# Patient Record
Sex: Male | Born: 1990
Health system: Southern US, Community
[De-identification: ages and names within clinical notes are randomized; demographics above are authoritative.]

## PROBLEM LIST (undated history)

## (undated) DIAGNOSIS — E119 Type 2 diabetes mellitus without complications: Secondary | ICD-10-CM

## (undated) DIAGNOSIS — F840 Autistic disorder: Secondary | ICD-10-CM

## (undated) HISTORY — DX: Autistic disorder: F84.0

## (undated) HISTORY — PX: WISDOM TOOTH EXTRACTION: SHX21

## (undated) HISTORY — DX: Type 2 diabetes mellitus without complications: E11.9

---
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
## 2008-01-08 ENCOUNTER — Ambulatory Visit (HOSPITAL_COMMUNITY): Payer: Self-pay | Admitting: Psychiatry

## 2008-04-01 ENCOUNTER — Ambulatory Visit (HOSPITAL_COMMUNITY): Payer: Self-pay | Admitting: Psychiatry

## 2008-06-26 ENCOUNTER — Ambulatory Visit (HOSPITAL_COMMUNITY): Payer: Self-pay | Admitting: Psychiatry

## 2008-09-27 ENCOUNTER — Ambulatory Visit (HOSPITAL_COMMUNITY): Payer: Self-pay | Admitting: Psychiatry

## 2008-12-27 ENCOUNTER — Ambulatory Visit (HOSPITAL_COMMUNITY): Payer: Self-pay | Admitting: Psychiatry

## 2009-04-25 ENCOUNTER — Ambulatory Visit (HOSPITAL_COMMUNITY): Payer: Self-pay | Admitting: Psychiatry

## 2009-09-19 ENCOUNTER — Ambulatory Visit (HOSPITAL_COMMUNITY): Payer: Self-pay | Admitting: Psychiatry

## 2010-01-09 ENCOUNTER — Ambulatory Visit (HOSPITAL_COMMUNITY): Payer: Self-pay | Admitting: Psychiatry

## 2010-04-14 ENCOUNTER — Ambulatory Visit (HOSPITAL_COMMUNITY): Payer: Self-pay | Admitting: Psychiatry

## 2010-07-01 ENCOUNTER — Ambulatory Visit (HOSPITAL_COMMUNITY): Payer: Self-pay | Admitting: Psychiatry

## 2010-10-30 ENCOUNTER — Ambulatory Visit (HOSPITAL_COMMUNITY): Payer: Self-pay | Admitting: Psychiatry

## 2011-01-05 ENCOUNTER — Encounter
Admission: RE | Admit: 2011-01-05 | Discharge: 2011-01-19 | Payer: Self-pay | Source: Home / Self Care | Attending: Emergency Medicine | Admitting: Emergency Medicine

## 2011-01-29 ENCOUNTER — Encounter (HOSPITAL_COMMUNITY): Payer: No Typology Code available for payment source | Admitting: Physician Assistant

## 2011-01-29 DIAGNOSIS — F411 Generalized anxiety disorder: Secondary | ICD-10-CM

## 2011-01-29 DIAGNOSIS — F988 Other specified behavioral and emotional disorders with onset usually occurring in childhood and adolescence: Secondary | ICD-10-CM

## 2011-04-30 ENCOUNTER — Encounter (HOSPITAL_COMMUNITY): Payer: No Typology Code available for payment source | Admitting: Physician Assistant

## 2011-04-30 DIAGNOSIS — F988 Other specified behavioral and emotional disorders with onset usually occurring in childhood and adolescence: Secondary | ICD-10-CM

## 2011-04-30 DIAGNOSIS — F411 Generalized anxiety disorder: Secondary | ICD-10-CM

## 2011-07-29 ENCOUNTER — Encounter (HOSPITAL_COMMUNITY): Payer: No Typology Code available for payment source | Admitting: Physician Assistant

## 2011-07-29 DIAGNOSIS — F988 Other specified behavioral and emotional disorders with onset usually occurring in childhood and adolescence: Secondary | ICD-10-CM

## 2011-07-29 DIAGNOSIS — F411 Generalized anxiety disorder: Secondary | ICD-10-CM

## 2011-07-30 ENCOUNTER — Encounter (HOSPITAL_COMMUNITY): Payer: No Typology Code available for payment source | Admitting: Physician Assistant

## 2011-11-01 ENCOUNTER — Other Ambulatory Visit (HOSPITAL_COMMUNITY): Payer: Self-pay | Admitting: *Deleted

## 2011-11-01 DIAGNOSIS — F411 Generalized anxiety disorder: Secondary | ICD-10-CM

## 2011-11-01 MED ORDER — METHYLPHENIDATE HCL ER (OSM) 36 MG PO TBCR: 36.0000 mg | EXTENDED_RELEASE_TABLET | ORAL | Status: DC

## 2011-11-02 ENCOUNTER — Encounter (HOSPITAL_COMMUNITY): Payer: No Typology Code available for payment source | Admitting: Physician Assistant

## 2011-11-08 ENCOUNTER — Other Ambulatory Visit (HOSPITAL_COMMUNITY): Payer: Self-pay

## 2011-11-09 ENCOUNTER — Ambulatory Visit (INDEPENDENT_AMBULATORY_CARE_PROVIDER_SITE_OTHER): Payer: No Typology Code available for payment source | Admitting: Physician Assistant

## 2011-11-09 DIAGNOSIS — F988 Other specified behavioral and emotional disorders with onset usually occurring in childhood and adolescence: Secondary | ICD-10-CM

## 2011-11-09 DIAGNOSIS — F411 Generalized anxiety disorder: Secondary | ICD-10-CM

## 2011-11-09 MED ORDER — METHYLPHENIDATE HCL ER (OSM) 36 MG PO TBCR: 36.0000 mg | EXTENDED_RELEASE_TABLET | ORAL | Status: DC

## 2011-11-09 MED ORDER — SERTRALINE HCL 50 MG PO TABS: 50.0000 mg | ORAL_TABLET | Freq: Every day | ORAL | Status: DC

## 2011-11-09 NOTE — Progress Notes (Signed)
   [facility] Follow-up Outpatient Visit  Alexander Wilkerson 1991-08-27  Date: 11/09/11   Subjective: Patient reports doing well.  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  Reports his mood is stable and anxiety is well managed.  Sleep and appetite are good.  Denies SI/HI or AVH.   There were no vitals filed for this visit.  Mental Status Examination  Appearance: Casual Alert: Yes Attention: good  Cooperative: Yes Eye Contact: Fair Speech: Clear and even Psychomotor Activity: Normal Memory/Concentration: WNL Oriented: person, place, time/date and situation Mood: Euthymic Affect: Congruent Thought Processes and Associations: Goal Directed Fund of Knowledge: Good Thought Content:  Insight: Good Judgement: Good  Diagnosis: GAD and ADHD  Treatment Plan: Continue current meds and Follow up in three months  Evadna Donaghy, PA

## 2012-02-09 ENCOUNTER — Ambulatory Visit (HOSPITAL_COMMUNITY): Payer: No Typology Code available for payment source | Admitting: Physician Assistant

## 2012-02-14 ENCOUNTER — Ambulatory Visit (INDEPENDENT_AMBULATORY_CARE_PROVIDER_SITE_OTHER): Payer: No Typology Code available for payment source | Admitting: Physician Assistant

## 2012-02-14 DIAGNOSIS — F988 Other specified behavioral and emotional disorders with onset usually occurring in childhood and adolescence: Secondary | ICD-10-CM

## 2012-02-14 DIAGNOSIS — F411 Generalized anxiety disorder: Secondary | ICD-10-CM

## 2012-02-14 MED ORDER — METHYLPHENIDATE HCL ER (OSM) 36 MG PO TBCR: 36.0000 mg | EXTENDED_RELEASE_TABLET | Freq: Every day | ORAL | Status: DC

## 2012-02-14 MED ORDER — METHYLPHENIDATE HCL ER (OSM) 36 MG PO TBCR: 36.0000 mg | EXTENDED_RELEASE_TABLET | ORAL | Status: DC

## 2012-02-14 NOTE — Progress Notes (Signed)
   [facility] Follow-up Outpatient Visit  DURAN OHERN [birth year]  Date: 02/14/12   Subjective: Leonce presents today to followup on his medications for ADHD and anxiety. 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 He feels that his medications are working well. He states that his anxiety is well managed. He feels that the Concerta is appropriate for his ADHD. He denies any SI/HI or AVH.  There were no vitals filed for this visit.  Mental Status Examination  Appearance: Well groomed and casually dressed Alert: Yes Attention: good  Cooperative: Yes Eye Contact: Good Speech: Clear and even Psychomotor Activity: Normal Memory/Concentration: Intact Oriented: person, place, time/date and situation Mood: Euthymic Affect: Constricted Thought Processes and Associations: Coherent and Logical Fund of Knowledge: Good Thought Content:  Insight: Good Judgement: Good  Diagnosis: ADHD, generalized anxiety disorder  Treatment Plan: Continue Concerta at 36 mg daily and Zoloft at 50 mg daily. Followup in 3 months.  Terance Pomplun, PA

## 2012-05-08 ENCOUNTER — Ambulatory Visit (INDEPENDENT_AMBULATORY_CARE_PROVIDER_SITE_OTHER): Payer: No Typology Code available for payment source | Admitting: Physician Assistant

## 2012-05-08 DIAGNOSIS — F988 Other specified behavioral and emotional disorders with onset usually occurring in childhood and adolescence: Secondary | ICD-10-CM | POA: Insufficient documentation

## 2012-05-08 DIAGNOSIS — F909 Attention-deficit hyperactivity disorder, unspecified type: Secondary | ICD-10-CM

## 2012-05-08 DIAGNOSIS — F411 Generalized anxiety disorder: Secondary | ICD-10-CM | POA: Insufficient documentation

## 2012-05-08 MED ORDER — METHYLPHENIDATE HCL ER (OSM) 36 MG PO TBCR: 36.0000 mg | EXTENDED_RELEASE_TABLET | Freq: Every day | ORAL | Status: DC

## 2012-05-08 MED ORDER — SERTRALINE HCL 50 MG PO TABS: 50.0000 mg | ORAL_TABLET | Freq: Every day | ORAL | Status: DC

## 2012-05-08 MED ORDER — METHYLPHENIDATE HCL ER (OSM) 36 MG PO TBCR: 36.0000 mg | EXTENDED_RELEASE_TABLET | ORAL | Status: DC

## 2012-05-08 NOTE — Progress Notes (Signed)
   [facility] Behavioral Health Follow-up Outpatient Visit  Alexander Wilkerson 02/02/1991  Date: 05/08/2012   Subjective: Alexander Wilkerson presents today to followup on medications prescribed for depression and ADHD. 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 He reports that his mood has been stable his anxiety is well managed. He denies any problems with the medications. He is sleeping and eating well.  There were no vitals filed for this visit.  Mental Status Examination  Appearance: Well groomed and casually dressed Alert: Yes Attention: good  Cooperative: Yes Eye Contact: Fair Speech: Clear and coherent Psychomotor Activity: Normal Memory/Concentration: Intact Oriented: person, place, time/date and situation Mood: Euthymic Affect: Appropriate Thought Processes and Associations: Linear Fund of Knowledge: Good Thought Content: Normal Insight: Good Judgement: Good  Diagnosis: Generalized anxiety disorder, ADHD  Treatment Plan: We'll continue his Zoloft 50 mg daily, and Concerta 36 mg daily. He will followup in 3 months.  Katrese Shell, PA-C

## 2012-09-05 ENCOUNTER — Other Ambulatory Visit (HOSPITAL_COMMUNITY): Payer: Self-pay | Admitting: *Deleted

## 2012-09-05 DIAGNOSIS — F988 Other specified behavioral and emotional disorders with onset usually occurring in childhood and adolescence: Secondary | ICD-10-CM

## 2012-09-05 MED ORDER — METHYLPHENIDATE HCL ER (OSM) 36 MG PO TBCR: 36.0000 mg | EXTENDED_RELEASE_TABLET | Freq: Every day | ORAL | Status: DC

## 2012-09-05 NOTE — Telephone Encounter (Signed)
Jorje Guild, PA not available to sign RX printed earlier. Reprinted for Dr.Kumar's signature

## 2012-09-11 ENCOUNTER — Ambulatory Visit (INDEPENDENT_AMBULATORY_CARE_PROVIDER_SITE_OTHER): Payer: No Typology Code available for payment source | Admitting: Physician Assistant

## 2012-09-11 DIAGNOSIS — F988 Other specified behavioral and emotional disorders with onset usually occurring in childhood and adolescence: Secondary | ICD-10-CM

## 2012-09-11 DIAGNOSIS — F411 Generalized anxiety disorder: Secondary | ICD-10-CM

## 2012-09-11 MED ORDER — METHYLPHENIDATE HCL ER (OSM) 36 MG PO TBCR: 36.0000 mg | EXTENDED_RELEASE_TABLET | ORAL | Status: DC

## 2012-09-11 MED ORDER — METHYLPHENIDATE HCL ER (OSM) 36 MG PO TBCR: 36.0000 mg | EXTENDED_RELEASE_TABLET | Freq: Every day | ORAL | Status: DC

## 2012-09-11 NOTE — Progress Notes (Signed)
   [facility] Follow-up Outpatient Visit  Alexander Wilkerson 09-02-1991  Date: 09/11/2012   Subjective: Alexander Wilkerson presents today to followup on his treatment for ADHD and anxiety. He has started a new job in June working at the [employer] on [employer], [employer]. He reports that his anxiety is well managed, and feels that the Concerta is working well for his inattentiveness. He denies any suicidal or homicidal ideation. He denies any auditory or visual hallucinations. His sleep and appetite are good.  There were no vitals filed for this visit.  Mental Status Examination  Appearance: Casual Alert: Yes Attention: good  Cooperative: Yes Eye Contact: Good Speech: Clear and coherent Psychomotor Activity: Normal Memory/Concentration: Intact Oriented: person, place, time/date and situation Mood: Euthymic Affect: Appropriate Thought Processes and Associations: Linear Fund of Knowledge: Good Thought Content: Normal Insight: Good Judgement: Good  Diagnosis: ADHD, inattentive type; generalized anxiety disorder  Treatment Plan: We'll continue his Concerta 36 mg daily, and Zoloft 50 mg daily. He will return for followup in 3 months.  Alba Perillo, PA-C

## 2012-11-07 ENCOUNTER — Other Ambulatory Visit (HOSPITAL_COMMUNITY): Payer: Self-pay | Admitting: *Deleted

## 2012-11-07 DIAGNOSIS — F411 Generalized anxiety disorder: Secondary | ICD-10-CM

## 2012-11-07 MED ORDER — SERTRALINE HCL 50 MG PO TABS: 50.0000 mg | ORAL_TABLET | Freq: Every day | ORAL | Status: DC

## 2012-11-14 ENCOUNTER — Other Ambulatory Visit (HOSPITAL_COMMUNITY): Payer: Self-pay | Admitting: Physician Assistant

## 2012-11-30 ENCOUNTER — Ambulatory Visit (INDEPENDENT_AMBULATORY_CARE_PROVIDER_SITE_OTHER): Payer: No Typology Code available for payment source | Admitting: Physician Assistant

## 2012-11-30 DIAGNOSIS — F988 Other specified behavioral and emotional disorders with onset usually occurring in childhood and adolescence: Secondary | ICD-10-CM

## 2012-11-30 DIAGNOSIS — F411 Generalized anxiety disorder: Secondary | ICD-10-CM

## 2012-11-30 MED ORDER — METHYLPHENIDATE HCL ER (OSM) 36 MG PO TBCR: 36.0000 mg | EXTENDED_RELEASE_TABLET | ORAL | Status: DC

## 2012-11-30 MED ORDER — METHYLPHENIDATE HCL ER (OSM) 36 MG PO TBCR: 36.0000 mg | EXTENDED_RELEASE_TABLET | Freq: Every day | ORAL | Status: DC

## 2012-11-30 MED ORDER — SERTRALINE HCL 50 MG PO TABS: 50.0000 mg | ORAL_TABLET | Freq: Every day | ORAL | Status: DC

## 2012-11-30 NOTE — Progress Notes (Signed)
   [facility] Behavioral Health Follow-up Outpatient Visit  Alexander Wilkerson Aug 13, 1991  Date: 11/30/2012   Subjective: Alexander Wilkerson presents today to followup on his treatment for anxiety and depression. He reports he has been doing well. 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 He feels his anxiety is well managed. He feels the Concerta is still helpful, and denies any bothersome side effects. He is sleeping well and his appetite is good. He denies any suicidal or homicidal ideation. He denies any auditory or visual hallucinations.  There were no vitals filed for this visit.  Mental Status Examination  Appearance: Casual Alert: Yes Attention: good  Cooperative: Yes Eye Contact: Good Speech: Clear and coherent Psychomotor Activity: Normal Memory/Concentration: Intact Oriented: person, place, time/date and situation Mood: Euthymic Affect: Appropriate Thought Processes and Associations: Linear Fund of Knowledge: Good Thought Content: Normal Insight: Good Judgement: Good  Diagnosis: ADHD, inattentive type; generalized anxiety disorder  Treatment Plan: We will continue his Concerta 36 mg daily, and Zoloft 50 mg daily. He will return for followup in 3 months.  Ishaan Villamar, PA-C

## 2013-03-01 ENCOUNTER — Ambulatory Visit (INDEPENDENT_AMBULATORY_CARE_PROVIDER_SITE_OTHER): Payer: No Typology Code available for payment source | Admitting: Physician Assistant

## 2013-03-01 DIAGNOSIS — F988 Other specified behavioral and emotional disorders with onset usually occurring in childhood and adolescence: Secondary | ICD-10-CM

## 2013-03-01 DIAGNOSIS — F411 Generalized anxiety disorder: Secondary | ICD-10-CM

## 2013-03-01 MED ORDER — METHYLPHENIDATE HCL ER (OSM) 36 MG PO TBCR: 36.0000 mg | EXTENDED_RELEASE_TABLET | Freq: Every day | ORAL | Status: DC

## 2013-03-01 MED ORDER — METHYLPHENIDATE HCL ER (OSM) 36 MG PO TBCR: 36.0000 mg | EXTENDED_RELEASE_TABLET | ORAL | Status: DC
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
## 2013-06-04 ENCOUNTER — Ambulatory Visit (HOSPITAL_COMMUNITY): Payer: Self-pay | Admitting: Physician Assistant

## 2013-06-05 ENCOUNTER — Other Ambulatory Visit (HOSPITAL_COMMUNITY): Payer: Self-pay | Admitting: Physician Assistant

## 2013-06-19 ENCOUNTER — Encounter (HOSPITAL_COMMUNITY): Payer: Self-pay | Admitting: Physician Assistant

## 2013-06-19 ENCOUNTER — Ambulatory Visit (INDEPENDENT_AMBULATORY_CARE_PROVIDER_SITE_OTHER): Payer: No Typology Code available for payment source | Admitting: Physician Assistant

## 2013-06-19 VITALS — BP 120/74 | HR 88 | Ht 64.0 in | Wt 152.2 lb

## 2013-06-19 DIAGNOSIS — F988 Other specified behavioral and emotional disorders with onset usually occurring in childhood and adolescence: Secondary | ICD-10-CM

## 2013-06-19 DIAGNOSIS — F411 Generalized anxiety disorder: Secondary | ICD-10-CM

## 2013-06-19 MED ORDER — METHYLPHENIDATE HCL ER (OSM) 36 MG PO TBCR: 36.0000 mg | EXTENDED_RELEASE_TABLET | ORAL | Status: DC

## 2013-06-19 MED ORDER — METHYLPHENIDATE HCL ER (OSM) 36 MG PO TBCR: 36.0000 mg | EXTENDED_RELEASE_TABLET | Freq: Every day | ORAL | Status: DC

## 2013-06-19 NOTE — Progress Notes (Signed)
   [facility] Behavioral Health Follow-up Outpatient Visit  Alexander Wilkerson [birth year]  Date: 06/19/2013   Subjective: [name] presents today to followup on his treatment for anxiety and ADHD. He reports that he is doing very well. He feels his anxiety is well controlled. Feels the Concerta is working well for him. He denies any side effects. He states that his appetite is good, and he is sleeping well. He denies any suicidal or homicidal ideation. He denies any auditory or visual hallucinations.  Filed Vitals:   06/19/13 1541  BP: 120/74  Pulse: 88    Mental Status Examination  Appearance: Casual Alert: Yes Attention: good  Cooperative: Yes Eye Contact: Good Speech: Clear and coherent Psychomotor Activity: Normal Memory/Concentration: Intact Oriented: person, place, time/date and situation Mood: Anxious Affect: Congruent Thought Processes and Associations: Logical Fund of Knowledge: Good Thought Content: Normal Insight: Good Judgement: Good  Diagnosis: Generalized anxiety disorder, ADHD inattentive type  Treatment Plan: We will continue his Concerta 36 mg daily, and his Zoloft 50 mg daily. He will return for followup in 3 months.  Editha Bridgeforth, PA-C

## 2013-09-20 ENCOUNTER — Encounter (HOSPITAL_COMMUNITY): Payer: Self-pay | Admitting: Physician Assistant

## 2013-09-20 ENCOUNTER — Ambulatory Visit (INDEPENDENT_AMBULATORY_CARE_PROVIDER_SITE_OTHER): Payer: No Typology Code available for payment source | Admitting: Physician Assistant

## 2013-09-20 VITALS — BP 139/74 | HR 111 | Ht 64.0 in | Wt 150.0 lb

## 2013-09-20 DIAGNOSIS — F411 Generalized anxiety disorder: Secondary | ICD-10-CM

## 2013-09-20 DIAGNOSIS — F988 Other specified behavioral and emotional disorders with onset usually occurring in childhood and adolescence: Secondary | ICD-10-CM

## 2013-09-20 MED ORDER — METHYLPHENIDATE HCL ER (OSM) 36 MG PO TBCR: 36.0000 mg | EXTENDED_RELEASE_TABLET | Freq: Every day | ORAL | Status: DC

## 2013-09-20 MED ORDER — METHYLPHENIDATE HCL ER (OSM) 36 MG PO TBCR: 36.0000 mg | EXTENDED_RELEASE_TABLET | ORAL | Status: DC

## 2013-09-20 NOTE — Progress Notes (Signed)
   [facility] Behavioral Health Follow-up Outpatient Visit  Alexander Wilkerson 1991-05-20  Date: 09/20/2013  Subjective: [name] presents today to followup on his treatment for ADHD and anxiety. He reports everything is going well. 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 He reports that his anxiety is well managed. He also feels that the Concerta is doing a good job. He denies any side effects. He reports that he is eating and sleeping well.  Filed Vitals:   09/20/13 1504  BP: 139/74  Pulse: 111    Mental Status Examination  Appearance: Casual Alert: Yes Attention: good  Cooperative: Yes Eye Contact: Minimal Speech: Clear and coherent Psychomotor Activity: Normal Memory/Concentration: Intact Oriented: person, place, time/date and situation Mood: Anxious Affect: Congruent Thought Processes and Associations: Linear Fund of Knowledge: Good Thought Content: Normal Insight: Good Judgement: Good  Diagnosis: Generalized anxiety disorder, ADHD inattentive type  Treatment Plan: We will continue his Concerta 36 mg daily, and Zoloft 50 mg daily. He will return for followup in 3 months.  Mauriah Mcmillen, PA-C

## 2013-12-04 ENCOUNTER — Other Ambulatory Visit (HOSPITAL_COMMUNITY): Payer: Self-pay | Admitting: *Deleted

## 2013-12-04 DIAGNOSIS — F411 Generalized anxiety disorder: Secondary | ICD-10-CM

## 2013-12-04 MED ORDER — SERTRALINE HCL 50 MG PO TABS: ORAL_TABLET | ORAL | Status: DC

## 2013-12-04 NOTE — Telephone Encounter (Signed)
Chart reviewed Refill appropriate Appt with Dr. Arfeen 12/25/13 

## 2013-12-25 ENCOUNTER — Ambulatory Visit (HOSPITAL_COMMUNITY): Payer: Self-pay | Admitting: Physician Assistant

## 2013-12-25 ENCOUNTER — Encounter (HOSPITAL_COMMUNITY): Payer: Self-pay | Admitting: Psychiatry

## 2013-12-25 ENCOUNTER — Ambulatory Visit (INDEPENDENT_AMBULATORY_CARE_PROVIDER_SITE_OTHER): Payer: PRIVATE HEALTH INSURANCE | Admitting: Psychiatry

## 2013-12-25 VITALS — BP 124/71 | HR 82 | Ht 64.0 in | Wt 154.0 lb

## 2013-12-25 DIAGNOSIS — F988 Other specified behavioral and emotional disorders with onset usually occurring in childhood and adolescence: Secondary | ICD-10-CM

## 2013-12-25 DIAGNOSIS — Z79899 Other long term (current) drug therapy: Secondary | ICD-10-CM

## 2013-12-25 DIAGNOSIS — F411 Generalized anxiety disorder: Secondary | ICD-10-CM

## 2013-12-25 MED ORDER — METHYLPHENIDATE HCL ER (OSM) 36 MG PO TBCR: 36.0000 mg | EXTENDED_RELEASE_TABLET | ORAL | Status: DC

## 2013-12-25 MED ORDER — SERTRALINE HCL 50 MG PO TABS: ORAL_TABLET | ORAL | Status: DC

## 2013-12-25 NOTE — Progress Notes (Addendum)
[facility] [id number] Progress Note  Arvilla MarketCarter M Percle [id number] 22 y.o.  12/25/2013 3:52 PM  Chief Complaint:  I need my medication.  History of Present Illness: Patient is 23 year old Caucasian, single employed man who came for his appointment.  His been seen in this office since October 2006.  He is a diagnoses of generalized anxiety disorder and ADHD.  He is seeing physician Asst. Rae HalstedAllen Watt left the practice.  Patient is taking Concerta and Zoloft.  He is feeling good with his medication.  He denies any insomnia, irritability, anger or any mood swing.  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  Patient is not drinking or using any illegal substances.  He denied any side effects of medication.  He denies any recent panic attack or any nervousness.  He is able to do multitasking.  His appetite, sleep and vitals are stable and unchanged from the past.  He had a good Christmas.  He visited his adopted parents in [place] on Christmas.  He lives by himself.  He has no children.  He has one adopted younger sister who is studying in [place].  Suicidal Ideation: No Plan Formed: No Patient has means to carry out plan: No  Homicidal Ideation: No Plan Formed: No Patient has means to carry out plan: No  Review of Systems: Psychiatric: Agitation: No Hallucination: No Depressed Mood: No Insomnia: No Hypersomnia: No Altered Concentration: No Feels Worthless: No Grandiose Ideas: No Belief In Special Powers: No New/Increased Substance Abuse: No Compulsions: No  Neurologic: Headache: No Seizure: No Paresthesias: No  Past Medical Family, Social History:  Patient has no active medical problems.  He was diagnosed with diabetes mellitus and used to take medication however he is on diet control for past 2 years .  He has no blood work in past 2 years and he has no primary care physician.  He denies any history of traumatic brain injury.  Patient was adopted at a young  age.  Patient has no knowledge about his biological parents.  He lived by himself.  He has no children.  He has high school education.  Outpatient Encounter Prescriptions as of 12/25/2013  Medication Sig  . methylphenidate (CONCERTA) 36 MG CR tablet Take 1 tablet (36 mg total) by mouth every morning.  . sertraline (ZOLOFT) 50 MG tablet TAKE ONE TABLET BY MOUTH ONCE DAILY  . [DISCONTINUED] methylphenidate (CONCERTA) 36 MG CR tablet Take 1 tablet (36 mg total) by mouth daily.  . [DISCONTINUED] methylphenidate (CONCERTA) 36 MG CR tablet Take 1 tablet (36 mg total) by mouth every morning.  . [DISCONTINUED] methylphenidate (CONCERTA) 36 MG CR tablet Take 1 tablet (36 mg total) by mouth every morning.  . [DISCONTINUED] methylphenidate (CONCERTA) 36 MG CR tablet Take 1 tablet (36 mg total) by mouth every morning.  . [DISCONTINUED] sertraline (ZOLOFT) 50 MG tablet TAKE ONE TABLET BY MOUTH ONCE DAILY    Past Psychiatric History/Hospitalization(s): Patient has ADHD.  In the past he had tried Adderall.  He is taking Concerta and Zoloft since 2006 Anxiety: Yes Bipolar Disorder: No Depression: No Mania: No Psychosis: No Schizophrenia: No Personality Disorder: No Hospitalization for psychiatric illness: No History of Electroconvulsive Shock Therapy: No Prior Suicide Attempts: No  Physical Exam: Constitutional:  BP 124/71  Pulse 82  Ht 5\' 4"  (1.626 m)  Wt 154 lb (69.854 kg)  BMI 26.42 kg/m2  General Appearance: alert, oriented, no acute distress and well nourished  Musculoskeletal: Strength & Muscle Tone: within  normal limits Gait & Station: normal Patient leans: N/A  Psychiatric: Speech (describe rate, volume, coherence, spontaneity, and abnormalities if any): Slow but clear and coherent.  Thought Process (describe rate, content, abstract reasoning, and computation): Logical and goal directed.  Associations: Irrelevant and Intact  Thoughts: normal  Mental Status: Orientation:  oriented to person, place, time/date and situation Mood & Affect: anxiety Attention Span & Concentration: Fair  Medical Decision Making (Choose Three): Established Problem, Stable/Improving (1), Review of Psycho-Social Stressors (1), Review or order clinical lab tests (1), Decision to obtain old records (1), Review and summation of old records (2), Review of Last Therapy Session (1) and Review of Medication Regimen & Side Effects (2)  Assessment: Axis I: ADD, generalized anxiety disorder  Axis II: Deferred  Axis III: History of diabetes  Axis IV: Mild  Axis V: 65-70   Plan: I reviewed his symptoms, collateral information, psychosocial history and his current medication.  Patient is taking Concerta 36 mg in the morning and Zoloft 50 mg at bedtime.  Patient does not have any side effects.  He has no blood work and past few years.  I will order CBC, CMP, hemoglobin A1c and lipid panel.  Recommend to call us back if he has any question or any concern.  Followup in 2 months.Time spent 25 minutes.  More than 50% of the time spent in psychoeducation, counseling and coordination of care.  Discuss safety plan that anytime having active suicidal thoughts or homicidal thoughts then patient need to call 911 or go to the local emergency room.    Melisha Eggleton T., MD 12/25/2013

## 2014-02-23 LAB — COMPREHENSIVE METABOLIC PANEL
ALT: 26 U/L (ref 0–53)
AST: 18 U/L (ref 0–37)
Albumin: 4.9 g/dL (ref 3.5–5.2)
Alkaline Phosphatase: 108 U/L (ref 39–117)
BILIRUBIN TOTAL: 0.6 mg/dL (ref 0.2–1.2)
BUN: 12 mg/dL (ref 6–23)
CALCIUM: 9.2 mg/dL (ref 8.4–10.5)
CO2: 26 meq/L (ref 19–32)
Chloride: 104 mEq/L (ref 96–112)
Creat: 1 mg/dL (ref 0.50–1.35)
Glucose, Bld: 81 mg/dL (ref 70–99)
POTASSIUM: 4.4 meq/L (ref 3.5–5.3)
SODIUM: 141 meq/L (ref 135–145)
Total Protein: 7 g/dL (ref 6.0–8.3)

## 2014-02-23 LAB — CBC WITH DIFFERENTIAL/PLATELET
BASOS ABS: 0 10*3/uL (ref 0.0–0.1)
Basophils Relative: 0 % (ref 0–1)
Eosinophils Absolute: 0.1 10*3/uL (ref 0.0–0.7)
Eosinophils Relative: 2 % (ref 0–5)
HEMATOCRIT: 50 % (ref 39.0–52.0)
HEMOGLOBIN: 17.7 g/dL — AB (ref 13.0–17.0)
Lymphocytes Relative: 38 % (ref 12–46)
Lymphs Abs: 2 10*3/uL (ref 0.7–4.0)
MCH: 30.7 pg (ref 26.0–34.0)
MCHC: 35.4 g/dL (ref 30.0–36.0)
MCV: 86.7 fL (ref 78.0–100.0)
MONO ABS: 0.4 10*3/uL (ref 0.1–1.0)
Monocytes Relative: 7 % (ref 3–12)
NEUTROS ABS: 2.8 10*3/uL (ref 1.7–7.7)
NEUTROS PCT: 53 % (ref 43–77)
PLATELETS: 265 10*3/uL (ref 150–400)
RBC: 5.77 MIL/uL (ref 4.22–5.81)
RDW: 13.3 % (ref 11.5–15.5)
WBC: 5.3 10*3/uL (ref 4.0–10.5)

## 2014-02-23 LAB — LIPID PANEL
CHOL/HDL RATIO: 3.7 ratio
CHOLESTEROL: 138 mg/dL (ref 0–200)
HDL: 37 mg/dL — AB (ref >39)
LDL CALC: 90 mg/dL (ref 0–99)
TRIGLYCERIDES: 56 mg/dL (ref <150)
VLDL: 11 mg/dL (ref 0–40)

## 2014-02-23 LAB — HEMOGLOBIN A1C
HEMOGLOBIN A1C: 5.3 % (ref <5.7)
Mean Plasma Glucose: 105 mg/dL (ref <117)

## 2014-02-23 LAB — TSH: TSH: 2.641 u[IU]/mL (ref 0.350–4.500)

## 2014-02-25 ENCOUNTER — Ambulatory Visit (INDEPENDENT_AMBULATORY_CARE_PROVIDER_SITE_OTHER): Payer: PRIVATE HEALTH INSURANCE | Admitting: Psychiatry

## 2014-02-25 ENCOUNTER — Encounter (HOSPITAL_COMMUNITY): Payer: Self-pay | Admitting: Psychiatry

## 2014-02-25 VITALS — BP 122/78 | HR 76 | Ht 64.0 in | Wt 147.2 lb

## 2014-02-25 DIAGNOSIS — F411 Generalized anxiety disorder: Secondary | ICD-10-CM

## 2014-02-25 DIAGNOSIS — F988 Other specified behavioral and emotional disorders with onset usually occurring in childhood and adolescence: Secondary | ICD-10-CM

## 2014-02-25 MED ORDER — SERTRALINE HCL 50 MG PO TABS: ORAL_TABLET | ORAL | Status: DC

## 2014-02-25 MED ORDER — METHYLPHENIDATE HCL ER (OSM) 36 MG PO TBCR: 36.0000 mg | EXTENDED_RELEASE_TABLET | ORAL | Status: DC

## 2014-02-25 NOTE — Progress Notes (Signed)
Alexander Wilkerson [id number] Progress Note  Alexander Wilkerson [id number] 23 y.o.  02/25/2014 3:57 PM  Chief Complaint:  Medication management of followup.  History of Present Illness: Guarded aim for his followup appointment.  His compliance with his psychotropic medication.  He denies any recent anxiety or panic attack.  He is working as a Development worker, international aid .  He likes his job.  He denies any irritability, anger or any mood swing.  He is able to do multitasking.  His attention and focus is good.  He has no tremors or shakes.  His appetite is good.  He has blood work which is normal.  His hemoglobins he is 5.3.  CBC is normal.  His basic chemistries also normal.  He is not drinking or using any illegal substances.  Suicidal Ideation: No Plan Formed: No Patient has means to carry out plan: No  Homicidal Ideation: No Plan Formed: No Patient has means to carry out plan: No  Review of Systems: Psychiatric: Agitation: No Hallucination: No Depressed Mood: No Insomnia: No Hypersomnia: No Altered Concentration: No Feels Worthless: No Grandiose Ideas: No Belief In Special Powers: No New/Increased Substance Abuse: No Compulsions: No  Neurologic: Headache: No Seizure: No Paresthesias: No  Past Medical Family, Social History:  Patient has no active medical problems.  He was diagnosed with diabetes mellitus and used to take medication however he is on diet control for past 2 years .  He has no blood work in past 2 years and he has no primary care physician.  He denies any history of traumatic brain injury.  Patient was adopted at a young age.  Patient has no knowledge about his biological parents.  He lived by himself.  He has no children.  He has high school education.  Outpatient Encounter Prescriptions as of 02/25/2014  Medication Sig  . methylphenidate (CONCERTA) 36 MG CR tablet Take 1 tablet (36 mg total) by mouth every morning.  . sertraline (ZOLOFT) 50 MG tablet TAKE ONE TABLET BY MOUTH  ONCE DAILY  . [DISCONTINUED] methylphenidate (CONCERTA) 36 MG CR tablet Take 1 tablet (36 mg total) by mouth every morning.  . [DISCONTINUED] methylphenidate (CONCERTA) 36 MG CR tablet Take 1 tablet (36 mg total) by mouth every morning.  . [DISCONTINUED] methylphenidate (CONCERTA) 36 MG CR tablet Take 1 tablet (36 mg total) by mouth every morning.  . [DISCONTINUED] sertraline (ZOLOFT) 50 MG tablet TAKE ONE TABLET BY MOUTH ONCE DAILY    Past Psychiatric History/Hospitalization(s): Patient has ADHD.  In the past he had tried Adderall.  He is taking Concerta and Zoloft since 2006 Anxiety: Yes Bipolar Disorder: No Depression: No Mania: No Psychosis: No Schizophrenia: No Personality Disorder: No Hospitalization for psychiatric illness: No History of Electroconvulsive Shock Therapy: No Prior Suicide Attempts: No  Recent Results (from the past 2160 hour(s))  HEMOGLOBIN A1C     Status: None   Collection Time    02/23/14  8:16 AM      Result Value Ref Range   Hemoglobin A1C 5.3  <5.7 %   Comment:                                                                            According  to the ADA Clinical Practice Recommendations for 2011, when     HbA1c is used as a screening test:             >=6.5%   Diagnostic of Diabetes Mellitus                (if abnormal result is confirmed)           5.7-6.4%   Increased risk of developing Diabetes Mellitus           References:Diagnosis and Classification of Diabetes Mellitus,Diabetes     TWSF,6812,75(TZGYF 1):S62-S69 and Standards of Medical Care in             Diabetes - 2011,Diabetes VCBS,4967,59 (Suppl 1):S11-S61.         Mean Plasma Glucose 105  <117 mg/dL  CBC WITH DIFFERENTIAL     Status: Abnormal   Collection Time    02/23/14  8:16 AM      Result Value Ref Range   WBC 5.3  4.0 - 10.5 K/uL   RBC 5.77  4.22 - 5.81 MIL/uL   Hemoglobin 17.7 (*) 13.0 - 17.0 g/dL   HCT 50.0  39.0 - 52.0 %   MCV 86.7  78.0 - 100.0 fL   MCH 30.7  26.0 -  34.0 pg   MCHC 35.4  30.0 - 36.0 g/dL   RDW 13.3  11.5 - 15.5 %   Platelets 265  150 - 400 K/uL   Neutrophils Relative % 53  43 - 77 %   Neutro Abs 2.8  1.7 - 7.7 K/uL   Lymphocytes Relative 38  12 - 46 %   Lymphs Abs 2.0  0.7 - 4.0 K/uL   Monocytes Relative 7  3 - 12 %   Monocytes Absolute 0.4  0.1 - 1.0 K/uL   Eosinophils Relative 2  0 - 5 %   Eosinophils Absolute 0.1  0.0 - 0.7 K/uL   Basophils Relative 0  0 - 1 %   Basophils Absolute 0.0  0.0 - 0.1 K/uL   Smear Review Criteria for review not met    COMPREHENSIVE METABOLIC PANEL     Status: None   Collection Time    02/23/14  8:16 AM      Result Value Ref Range   Sodium 141  135 - 145 mEq/L   Potassium 4.4  3.5 - 5.3 mEq/L   Chloride 104  96 - 112 mEq/L   CO2 26  19 - 32 mEq/L   Glucose, Bld 81  70 - 99 mg/dL   BUN 12  6 - 23 mg/dL   Creat 1.00  0.50 - 1.35 mg/dL   Total Bilirubin 0.6  0.2 - 1.2 mg/dL   Alkaline Phosphatase 108  39 - 117 U/L   AST 18  0 - 37 U/L   ALT 26  0 - 53 U/L   Total Protein 7.0  6.0 - 8.3 g/dL   Albumin 4.9  3.5 - 5.2 g/dL   Calcium 9.2  8.4 - 10.5 mg/dL  LIPID PANEL     Status: Abnormal   Collection Time    02/23/14  8:16 AM      Result Value Ref Range   Cholesterol 138  0 - 200 mg/dL   Comment: ATP III Classification:           < 200        mg/dL        Desirable  200 - 239     mg/dL        Borderline High          >= 240        mg/dL        High         Triglycerides 56  <150 mg/dL   HDL 37 (*) >39 mg/dL   Total CHOL/HDL Ratio 3.7     VLDL 11  0 - 40 mg/dL   LDL Cholesterol 90  0 - 99 mg/dL   Comment:       Total Cholesterol/HDL Ratio:CHD Risk                            Coronary Heart Disease Risk Table                                            Men       Women              1/2 Average Risk              3.4        3.3                  Average Risk              5.0        4.4               2X Average Risk              9.6        7.1               3X Average Risk              23.4       11.0     Use the calculated Patient Ratio above and the CHD Risk table      to determine the patient's CHD Risk.     ATP III Classification (LDL):           < 100        mg/dL         Optimal          100 - 129     mg/dL         Near or Above Optimal          130 - 159     mg/dL         Borderline High          160 - 189     mg/dL         High           > 190        mg/dL         Very High        TSH     Status: None   Collection Time    02/23/14  8:16 AM      Result Value Ref Range   TSH 2.641  0.350 - 4.500 uIU/mL    Physical Exam: Constitutional:  BP 122/78  Pulse 76  Ht $R'5\' 4"'Mu$  (1.626 m)  Wt 147 lb 3.2 oz (66.769 kg)  BMI 25.25 kg/m2  General Appearance: alert, oriented, no acute distress and  well nourished  Musculoskeletal: Strength & Muscle Tone: within normal limits Gait & Station: normal Patient leans: N/A  Psychiatric: Speech (describe rate, volume, coherence, spontaneity, and abnormalities if any): Slow but clear and coherent.  Thought Process (describe rate, content, abstract reasoning, and computation): Logical and goal directed.  Associations: Irrelevant and Intact  Thoughts: normal  Mental Status: Orientation: oriented to person, place, time/date and situation Mood & Affect: anxiety Attention Span & Concentration: Fair  Established Problem, Stable/Improving (1), Review or order clinical lab tests (1), Review of Last Therapy Session (1) and Review of Medication Regimen & Side Effects (2)  Assessment: Axis I: ADD, generalized anxiety disorder  Axis II: Deferred  Axis III: History of diabetes  Axis IV: Mild  Axis V: 65-70   Plan:  I reviewed his blood work and his current medication.  Patient is doing better on his medication.  A result discussed with him.  I will continue Concerta 36 mg daily and Zoloft 50 mg daily.  Followup in 3 months.  I recommended to call us back if he has any question or any concern.    Machi Whittaker T.,  MD 02/25/2014

## 2014-05-28 ENCOUNTER — Ambulatory Visit (INDEPENDENT_AMBULATORY_CARE_PROVIDER_SITE_OTHER): Payer: PRIVATE HEALTH INSURANCE | Admitting: Psychiatry

## 2014-05-28 ENCOUNTER — Encounter (HOSPITAL_COMMUNITY): Payer: Self-pay | Admitting: Psychiatry

## 2014-05-28 VITALS — BP 107/62 | HR 76 | Ht 64.0 in | Wt 148.0 lb

## 2014-05-28 DIAGNOSIS — F411 Generalized anxiety disorder: Secondary | ICD-10-CM

## 2014-05-28 DIAGNOSIS — F988 Other specified behavioral and emotional disorders with onset usually occurring in childhood and adolescence: Secondary | ICD-10-CM

## 2014-05-28 MED ORDER — SERTRALINE HCL 50 MG PO TABS: ORAL_TABLET | ORAL | Status: DC

## 2014-05-28 MED ORDER — METHYLPHENIDATE HCL ER (OSM) 36 MG PO TBCR: 36.0000 mg | EXTENDED_RELEASE_TABLET | ORAL | Status: DC

## 2014-05-28 NOTE — Progress Notes (Signed)
[facility] [id number] Progress Note  Alexander Wilkerson [id number] 23 y.o.  05/28/2014 4:10 PM  Chief Complaint:  Medication management of followup.  History of Present Illness: Alexander Wilkerson came for his followup appointment.  He is taking his medication as prescribed.  His attention and focus is good.  He is able to do multitasking.  He denies any irritability, anger, mood swing.  He denies any crying spells.  His sleep is good.  His appetite is good.  His vitals are stable.  He denies any panic attack .  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  Patient has no tremors or shakes.  He was to continue his medication.  He denies any chest pain, insomnia, irritability, anger or any hallucination.  He is not drinking or using any illegal substances.  He has blood work on 02/23/2014 which shows hemoglobin A1c is 5.3, his CBC is normal and his comprehensive metabolic panel was also normal.  Suicidal Ideation: No Plan Formed: No Patient has means to carry out plan: No  Homicidal Ideation: No Plan Formed: No Patient has means to carry out plan: No  Review of Systems: Psychiatric: Agitation: No Hallucination: No Depressed Mood: No Insomnia: No Hypersomnia: No Altered Concentration: No Feels Worthless: No Grandiose Ideas: No Belief In Special Powers: No New/Increased Substance Abuse: No Compulsions: No  Neurologic: Headache: No Seizure: No Paresthesias: No  Past Medical Family, Social History:  Patient has no active medical problems.  He was diagnosed with diabetes mellitus and used to take medication however he is on diet control for past 2 years .  He goes to Dr. Talmage Nap for his medical checkup.  Patient was adopted at a young age.  Patient has no knowledge about his biological parents.  He lived by himself.  He has no children.  He has high school education.  Outpatient Encounter Prescriptions as of 05/28/2014  Medication Sig  .  methylphenidate 36 MG PO CR tablet Take 1 tablet (36 mg total) by mouth every morning.  . sertraline (ZOLOFT) 50 MG tablet TAKE ONE TABLET BY MOUTH ONCE DAILY  . [DISCONTINUED] methylphenidate (CONCERTA) 36 MG CR tablet Take 1 tablet (36 mg total) by mouth every morning.  . [DISCONTINUED] methylphenidate 36 MG PO CR tablet Take 1 tablet (36 mg total) by mouth every morning.  . [DISCONTINUED] methylphenidate 36 MG PO CR tablet Take 1 tablet (36 mg total) by mouth every morning.  . [DISCONTINUED] sertraline (ZOLOFT) 50 MG tablet TAKE ONE TABLET BY MOUTH ONCE DAILY    Past Psychiatric History/Hospitalization(s): Patient has ADHD.  In the past he had tried Adderall.  He is taking Concerta and Zoloft since 2006 Anxiety: Yes Bipolar Disorder: No Depression: No Mania: No Psychosis: No Schizophrenia: No Personality Disorder: No Hospitalization for psychiatric illness: No History of Electroconvulsive Shock Therapy: No Prior Suicide Attempts: No  No results found for this or any previous visit (from the past 2160 hour(s)).  Physical Exam: Constitutional:  BP 107/62  Pulse 76  Ht 5\' 4"  (1.626 m)  Wt 148 lb (67.132 kg)  BMI 25.39 kg/m2  General Appearance: alert, oriented, no acute distress and well nourished  Musculoskeletal: Strength & Muscle Tone: within normal limits Gait & Station: normal Patient leans: N/A  Psychiatric: Speech (describe rate, volume, coherence, spontaneity, and abnormalities if any): Slow but clear and coherent.  Thought Process (describe rate, content, abstract reasoning, and computation): Logical and goal directed.  Associations: Coherent and  Intact  Thoughts: normal  Mental Status: Orientation: oriented to person, place, time/date and situation Mood & Affect: anxiety Attention Span & Concentration: Fair  Established Problem, Stable/Improving (1), Review or order clinical lab tests (1), Review of Last Therapy Session (1) and Review of Medication  Regimen & Side Effects (2)  Assessment: Axis I: ADD, generalized anxiety disorder  Axis II: Deferred  Axis III: History of diabetes  Axis IV: Mild  Axis V: 65-70   Plan:  I reviewed his blood work and his current medication.  Patient is doing better on his medication.  A result discussed with him.  I will continue Concerta 36 mg daily and Zoloft 50 mg daily.  Followup in 3 months.  I recommended to call us back if he has any question or any concern.    Gamaliel Charney T., MD 05/28/2014

## 2014-08-28 ENCOUNTER — Encounter (HOSPITAL_COMMUNITY): Payer: Self-pay | Admitting: Psychiatry

## 2014-08-28 ENCOUNTER — Ambulatory Visit (INDEPENDENT_AMBULATORY_CARE_PROVIDER_SITE_OTHER): Payer: PRIVATE HEALTH INSURANCE | Admitting: Psychiatry

## 2014-08-28 VITALS — BP 110/56 | HR 80 | Ht 64.0 in | Wt 139.2 lb

## 2014-08-28 DIAGNOSIS — F411 Generalized anxiety disorder: Secondary | ICD-10-CM

## 2014-08-28 DIAGNOSIS — F988 Other specified behavioral and emotional disorders with onset usually occurring in childhood and adolescence: Secondary | ICD-10-CM

## 2014-08-28 MED ORDER — METHYLPHENIDATE HCL ER (OSM) 36 MG PO TBCR: 36.0000 mg | EXTENDED_RELEASE_TABLET | ORAL | Status: DC

## 2014-08-28 MED ORDER — SERTRALINE HCL 50 MG PO TABS: ORAL_TABLET | ORAL | Status: DC

## 2014-08-28 NOTE — Progress Notes (Signed)
[facility] [id number] Progress Note  ALIJAH Wilkerson [id number] 23 y.o.  08/28/2014 4:22 PM  Chief Complaint:  Medication management of followup.  History of Present Illness: Alexander Wilkerson came for his followup appointment.  He is taking his medication as prescribed.  He denies any side effects of medication.  His vitals are stable.  He is able to do multitasking.  His attention, concentration and energy level is good.  He is sleeping good.  He denies any panic attack.  His appetite is okay.  His vitals are stable.  Patient lives by himself.  He has no children.  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  Suicidal Ideation: No Plan Formed: No Patient has means to carry out plan: No  Homicidal Ideation: No Plan Formed: No Patient has means to carry out plan: No  Review of Systems: Psychiatric: Agitation: No Hallucination: No Depressed Mood: No Insomnia: No Hypersomnia: No Altered Concentration: No Feels Worthless: No Grandiose Ideas: No Belief In Special Powers: No New/Increased Substance Abuse: No Compulsions: No  Neurologic: Headache: No Seizure: No Paresthesias: No  Past Medical Family, Social History:  Patient has no active medical problems.  He was diagnosed with diabetes mellitus and used to take medication however he is on diet control only.  He goes to Dr. Talmage Nap for his medical checkup.  Patient was adopted at a young age.  Patient has no knowledge about his biological parents.  He lived by himself.  He has no children.  He has high school education.  Outpatient Encounter Prescriptions as of 08/28/2014  Medication Sig  . methylphenidate 36 MG PO CR tablet Take 1 tablet (36 mg total) by mouth every morning.  . methylphenidate 36 MG PO CR tablet Take 1 tablet (36 mg total) by mouth every morning.  . methylphenidate 36 MG PO CR tablet Take 1 tablet (36 mg total) by mouth every morning.  . sertraline (ZOLOFT) 50 MG tablet TAKE ONE  TABLET BY MOUTH ONCE DAILY  . [DISCONTINUED] methylphenidate 36 MG PO CR tablet Take 1 tablet (36 mg total) by mouth every morning.  . [DISCONTINUED] sertraline (ZOLOFT) 50 MG tablet TAKE ONE TABLET BY MOUTH ONCE DAILY    Past Psychiatric History/Hospitalization(s): Patient has ADHD.  In the past he had tried Adderall.  He is taking Concerta and Zoloft since 2006 Anxiety: Yes Bipolar Disorder: No Depression: No Mania: No Psychosis: No Schizophrenia: No Personality Disorder: No Hospitalization for psychiatric illness: No History of Electroconvulsive Shock Therapy: No Prior Suicide Attempts: No  No results found for this or any previous visit (from the past 2160 hour(s)).  Physical Exam: Constitutional:  BP 110/56  Pulse 80  Ht  (1.626 m)  Wt 139 lb 3.2 oz (63.141 kg)  BMI 23.88 kg/m2  General Appearance: alert, oriented, no acute distress and well nourished  Musculoskeletal: Strength & Muscle Tone: within normal limits Gait & Station: normal Patient leans: N/A  Psychiatric: Speech (describe rate, volume, coherence, spontaneity, and abnormalities if any): Slow but clear and coherent.  Thought Process (describe rate, content, abstract reasoning, and computation): Logical and goal directed.  Associations: Coherent and Intact  Thoughts: normal  Mental Status: Orientation: oriented to person, place, time/date and situation Mood & Affect: anxiety Attention Span & Concentration: Fair  Established Problem, Stable/Improving (1), Review of Last Therapy Session (1) and Review of Medication Regimen & Side Effects (2)  Assessment: Axis I: ADD, generalized anxiety disorder  Axis II: Deferred  Axis III: History of diabetes  Axis IV: Mild  Axis V: 65-70   Plan:  Patient is a stable on his current medication.  I will continue Zoloft 50 mg daily and  Concerta 36 mg daily.  Followup in 3 months.  I recommended to call us back if he has any question or any  concern.    Kollins Fenter T., MD 08/28/2014

## 2014-11-27 ENCOUNTER — Encounter (HOSPITAL_COMMUNITY): Payer: Self-pay | Admitting: Psychiatry

## 2014-11-27 ENCOUNTER — Ambulatory Visit (INDEPENDENT_AMBULATORY_CARE_PROVIDER_SITE_OTHER): Payer: PRIVATE HEALTH INSURANCE | Admitting: Psychiatry

## 2014-11-27 VITALS — Wt 140.0 lb

## 2014-11-27 DIAGNOSIS — F909 Attention-deficit hyperactivity disorder, unspecified type: Secondary | ICD-10-CM

## 2014-11-27 DIAGNOSIS — F411 Generalized anxiety disorder: Secondary | ICD-10-CM

## 2014-11-27 DIAGNOSIS — F988 Other specified behavioral and emotional disorders with onset usually occurring in childhood and adolescence: Secondary | ICD-10-CM

## 2014-11-27 MED ORDER — METHYLPHENIDATE HCL ER (OSM) 36 MG PO TBCR: 36.0000 mg | EXTENDED_RELEASE_TABLET | ORAL | Status: DC

## 2014-11-27 MED ORDER — SERTRALINE HCL 50 MG PO TABS: ORAL_TABLET | ORAL | Status: DC

## 2014-11-27 NOTE — Progress Notes (Signed)
[facility] [id number] Progress Note  Arvilla MarketCarter M Wilkerson [id number] 23 y.o.  11/27/2014 3:53 PM  Chief Complaint:  Medication management of followup.  History of Present Illness: Alexander Wilkerson came for his followup appointment.  He is compliant with Concerta and Zoloft.  He has been lately very busy at work.  He worked for a [employer] company.  He had a good Thanksgiving.  His parents are moving to [place] from [place].  Patient told they are old and now they are downsizing.  He had a good Thanksgiving.  He denies any irritability, anger, mood swing.  His attention and concentration is good.  He is able to do multitasking.  He denies any crying spells.  His sleep is good.  His appetite is okay.  His vitals are stable.  He denies any tremors or shakes.  He lives by himself.  He has no children.    Suicidal Ideation: No Plan Formed: No Patient has means to carry out plan: No  Homicidal Ideation: No Plan Formed: No Patient has means to carry out plan: No  Review of Systems: Psychiatric: Agitation: No Hallucination: No Depressed Mood: No Insomnia: No Hypersomnia: No Altered Concentration: No Feels Worthless: No Grandiose Ideas: No Belief In Special Powers: No New/Increased Substance Abuse: No Compulsions: No  Neurologic: Headache: No Seizure: No Paresthesias: No  Past Medical Family, Social History:  Patient has no active medical problems.  He was diagnosed with diabetes mellitus and used to take medication however he is on diet control only.  He goes to Dr. Talmage NapBalan for his medical checkup.  Patient was adopted at a young age.  Patient has no knowledge about his biological parents.  He lived by himself.  He has no children.  He has high school education.  Outpatient Encounter Prescriptions as of 11/27/2014  Medication Sig  . methylphenidate 36 MG PO CR tablet Take 1 tablet (36 mg total) by mouth every morning.  . sertraline (ZOLOFT) 50 MG tablet TAKE ONE TABLET BY MOUTH  ONCE DAILY  . [DISCONTINUED] methylphenidate 36 MG PO CR tablet Take 1 tablet (36 mg total) by mouth every morning.  . [DISCONTINUED] methylphenidate 36 MG PO CR tablet Take 1 tablet (36 mg total) by mouth every morning.  . [DISCONTINUED] methylphenidate 36 MG PO CR tablet Take 1 tablet (36 mg total) by mouth every morning.  . [DISCONTINUED] methylphenidate 36 MG PO CR tablet Take 1 tablet (36 mg total) by mouth every morning.  . [DISCONTINUED] methylphenidate 36 MG PO CR tablet Take 1 tablet (36 mg total) by mouth every morning.  . [DISCONTINUED] sertraline (ZOLOFT) 50 MG tablet TAKE ONE TABLET BY MOUTH ONCE DAILY    Past Psychiatric History/Hospitalization(s): Patient has ADHD.  In the past he had tried Adderall.  He is taking Concerta and Zoloft since 2006 Anxiety: Yes Bipolar Disorder: No Depression: No Mania: No Psychosis: No Schizophrenia: No Personality Disorder: No Hospitalization for psychiatric illness: No History of Electroconvulsive Shock Therapy: No Prior Suicide Attempts: No  No results found for this or any previous visit (from the past 2160 hour(s)).  Physical Exam: Constitutional:  Wt 140 lb (63.504 kg)  General Appearance: alert, oriented, no acute distress and well nourished  Musculoskeletal: Strength & Muscle Tone: within normal limits Gait & Station: normal Patient leans: N/A  Psychiatric: Speech (describe rate, volume, coherence, spontaneity, and abnormalities if any): Slow but clear and coherent.  Thought Process (describe rate, content, abstract reasoning, and computation): Logical and goal directed.  Associations: Coherent and  Intact  Thoughts: normal  Mental Status: Orientation: oriented to person, place, time/date and situation Mood & Affect: normal affect Attention Span & Concentration: Fair  Established Problem, Stable/Improving (1), Review of Last Therapy Session (1) and Review of Medication Regimen & Side Effects (2)  Assessment: Axis  I: ADD, generalized anxiety disorder  Axis II: Deferred  Axis III: History of diabetes  Axis IV: Mild  Axis V: 65-70   Plan:  Patient is a stable on his current medication.  I will continue Zoloft 50 mg daily and  Concerta 36 mg daily.  Followup in 3 months.  I recommended to call us back if he has any question or any concern.    Leviathan Macera T., MD 11/27/2014

## 2015-01-09 ENCOUNTER — Other Ambulatory Visit (HOSPITAL_COMMUNITY): Payer: Self-pay

## 2015-01-09 NOTE — Telephone Encounter (Signed)
Per Wilder GladeShawn T., RN., cleared to do prior authorization, sometimes provider will print two scripts same day but put do not fill until certain day. Called [phone] to start prior authorization. I spoke with Sue Lushndrea and Sue Lushndrea transferred me to [phone] option 2 and then option 2. I spoke with Doralee AlbinoMichael H., and patient has been approved for three years. Fax will be sent to our office with approval information.  Pharmacy notified  Could not reach patient, no answer on phone but pharmacy said they will notify patient.

## 2015-01-09 NOTE — Telephone Encounter (Signed)
[facility] for Concerta. Patient's RX for Concerta is dated 11-27-2014 [place] and states do not fill until 01-27-2015. [facility] and spoke with NinaStephanie. Per Judeth CornfieldStephanie patient brought in RX dated 11-27-2014 and RX read do not fill until 12-27-2014, that is why they sent prior authorization request. Will send to Wilder GladeShawn T., RN for further review/clarification.

## 2015-01-09 NOTE — Telephone Encounter (Signed)
Telephone call with patient after a message he left stating a needed prior authorization for Concerta medications since changing back [facility].  Verified with [facility] prior [facility] needed and they will be faxing over form to complete.

## 2015-02-27 ENCOUNTER — Encounter (HOSPITAL_COMMUNITY): Payer: Self-pay | Admitting: Psychiatry

## 2015-02-27 ENCOUNTER — Ambulatory Visit (INDEPENDENT_AMBULATORY_CARE_PROVIDER_SITE_OTHER): Payer: No Typology Code available for payment source | Admitting: Psychiatry

## 2015-02-27 VITALS — BP 128/64 | HR 92 | Ht 64.0 in | Wt 133.0 lb

## 2015-02-27 DIAGNOSIS — F988 Other specified behavioral and emotional disorders with onset usually occurring in childhood and adolescence: Secondary | ICD-10-CM

## 2015-02-27 DIAGNOSIS — F909 Attention-deficit hyperactivity disorder, unspecified type: Secondary | ICD-10-CM

## 2015-02-27 DIAGNOSIS — Z79899 Other long term (current) drug therapy: Secondary | ICD-10-CM

## 2015-02-27 DIAGNOSIS — F411 Generalized anxiety disorder: Secondary | ICD-10-CM

## 2015-02-27 MED ORDER — METHYLPHENIDATE HCL ER (OSM) 36 MG PO TBCR: 36.0000 mg | EXTENDED_RELEASE_TABLET | ORAL | Status: DC

## 2015-02-27 MED ORDER — SERTRALINE HCL 50 MG PO TABS: ORAL_TABLET | ORAL | Status: DC

## 2015-02-27 NOTE — Progress Notes (Signed)
[facility] [id number] Progress Note  Alexander Wilkerson [id number] 23 y.o.  02/27/2015 4:40 PM  Chief Complaint:  Medication management of followup.  History of Present Illness: Alexander Wilkerson came for his followup appointment with his mother.  Recently he lost his job and his mother told that his supervisor was not supportive and cooperative.  However patient able to find another job but he is still looking a better job.  He is taking his medication.  He denies any irritability, anger, mood swing.  His appetite is okay.  He had a good Christmas.  His attention and concentration is better.  He denies any suicidal thoughts or homicidal thought.  His appetite is okay.  His vitals are stable.  He has no tremors or shakes.  Patient lives by himself and he has no children.  Suicidal Ideation: No Plan Formed: No Patient has means to carry out plan: No  Homicidal Ideation: No Plan Formed: No Patient has means to carry out plan: No  Review of Systems: Psychiatric: Agitation: No Hallucination: No Depressed Mood: No Insomnia: No Hypersomnia: No Altered Concentration: No Feels Worthless: No Grandiose Ideas: No Belief In Special Powers: No New/Increased Substance Abuse: No Compulsions: No  Neurologic: Headache: No Seizure: No Paresthesias: No  Past Medical Family, Social History:  Patient has no active medical problems.  He was diagnosed with diabetes mellitus and used to take medication however he is on diet control only.  He goes to Dr. Talmage NapBalan for his medical checkup.  Patient was adopted at a young age.  Patient has no knowledge about his biological parents.  He lived by himself.  He has no children.  He has high school education.  Outpatient Encounter Prescriptions as of 02/27/2015  Medication Sig  . methylphenidate 36 MG PO CR tablet Take 1 tablet (36 mg total) by mouth every morning.  . sertraline (ZOLOFT) 50 MG tablet TAKE ONE TABLET BY MOUTH ONCE DAILY  . [DISCONTINUED]  methylphenidate 36 MG PO CR tablet Take 1 tablet (36 mg total) by mouth every morning.  . [DISCONTINUED] methylphenidate 36 MG PO CR tablet Take 1 tablet (36 mg total) by mouth every morning.  . [DISCONTINUED] methylphenidate 36 MG PO CR tablet Take 1 tablet (36 mg total) by mouth every morning.  . [DISCONTINUED] sertraline (ZOLOFT) 50 MG tablet TAKE ONE TABLET BY MOUTH ONCE DAILY    Past Psychiatric History/Hospitalization(s): Patient has ADHD.  In the past he had tried Adderall.  He is taking Concerta and Zoloft since 2006 Anxiety: Yes Bipolar Disorder: No Depression: No Mania: No Psychosis: No Schizophrenia: No Personality Disorder: No Hospitalization for psychiatric illness: No History of Electroconvulsive Shock Therapy: No Prior Suicide Attempts: No  No results found for this or any previous visit (from the past 2160 hour(s)).  Physical Exam: Constitutional:  BP 128/64 mmHg  Pulse 92  Ht 5\' 4"  (1.626 m)  Wt 133 lb (60.328 kg)  BMI 22.82 kg/m2  General Appearance: alert, oriented, no acute distress and well nourished  Musculoskeletal: Strength & Muscle Tone: within normal limits Gait & Station: normal Patient leans: N/A  Psychiatric: Speech (describe rate, volume, coherence, spontaneity, and abnormalities if any): Slow but clear and coherent.  Thought Process (describe rate, content, abstract reasoning, and computation): Logical and goal directed.  Associations: Coherent and Intact  Thoughts: Slow  Mental Status: Orientation: oriented to person, place, time/date and situation Mood & Affect: anxiety Attention Span & Concentration: Fair  Established Problem, Stable/Improving (1), Review of Last Therapy Session (  1) and Review of Medication Regimen & Side Effects (2)  Assessment: Axis I: ADD, generalized anxiety disorder  Axis II: Deferred  Axis III: History of diabetes  Plan:  Patient is a stable on his current medication.  He is trying to get a better job  .  I will do CBC, CMP, hemoglobin A1c and TSH.  Continue Zoloft 50 mg daily and  Concerta 36 mg daily.  Followup in 3 months.  I recommended to call us back if he has any question or any concern.    Leeloo Silverthorne T., MD 02/27/2015

## 2015-03-08 LAB — CBC WITH DIFFERENTIAL/PLATELET
BASOS PCT: 1 % (ref 0–1)
Basophils Absolute: 0 10*3/uL (ref 0.0–0.1)
EOS PCT: 2 % (ref 0–5)
Eosinophils Absolute: 0.1 10*3/uL (ref 0.0–0.7)
HEMATOCRIT: 51 % (ref 39.0–52.0)
Hemoglobin: 17.5 g/dL — ABNORMAL HIGH (ref 13.0–17.0)
Lymphocytes Relative: 37 % (ref 12–46)
Lymphs Abs: 1.3 10*3/uL (ref 0.7–4.0)
MCH: 31.1 pg (ref 26.0–34.0)
MCHC: 34.3 g/dL (ref 30.0–36.0)
MCV: 90.6 fL (ref 78.0–100.0)
MONO ABS: 0.3 10*3/uL (ref 0.1–1.0)
MONOS PCT: 9 % (ref 3–12)
MPV: 10 fL (ref 8.6–12.4)
Neutro Abs: 1.7 10*3/uL (ref 1.7–7.7)
Neutrophils Relative %: 51 % (ref 43–77)
PLATELETS: 242 10*3/uL (ref 150–400)
RBC: 5.63 MIL/uL (ref 4.22–5.81)
RDW: 13 % (ref 11.5–15.5)
WBC: 3.4 10*3/uL — AB (ref 4.0–10.5)

## 2015-03-08 LAB — COMPLETE METABOLIC PANEL WITH GFR
ALK PHOS: 95 U/L (ref 39–117)
ALT: 21 U/L (ref 0–53)
AST: 17 U/L (ref 0–37)
Albumin: 4.7 g/dL (ref 3.5–5.2)
BUN: 18 mg/dL (ref 6–23)
CO2: 26 mEq/L (ref 19–32)
CREATININE: 1.03 mg/dL (ref 0.50–1.35)
Calcium: 9.7 mg/dL (ref 8.4–10.5)
Chloride: 102 mEq/L (ref 96–112)
GFR, Est Non African American: >89 mL/min
Glucose, Bld: 117 mg/dL — ABNORMAL HIGH (ref 70–99)
POTASSIUM: 4.6 meq/L (ref 3.5–5.3)
Sodium: 137 mEq/L (ref 135–145)
Total Bilirubin: 0.8 mg/dL (ref 0.2–1.2)
Total Protein: 7 g/dL (ref 6.0–8.3)

## 2015-03-09 LAB — HEMOGLOBIN A1C
Hgb A1c MFr Bld: 7.7 % — ABNORMAL HIGH (ref <5.7)
Mean Plasma Glucose: 174 mg/dL — ABNORMAL HIGH (ref <117)

## 2015-03-09 LAB — TSH: TSH: 1.614 u[IU]/mL (ref 0.350–4.500)

## 2015-05-26 ENCOUNTER — Ambulatory Visit (HOSPITAL_COMMUNITY): Payer: Self-pay | Admitting: Psychiatry

## 2015-06-12 ENCOUNTER — Encounter (HOSPITAL_COMMUNITY): Payer: Self-pay | Admitting: Psychiatry

## 2015-06-12 ENCOUNTER — Ambulatory Visit (INDEPENDENT_AMBULATORY_CARE_PROVIDER_SITE_OTHER): Payer: BLUE CROSS/BLUE SHIELD | Admitting: Psychiatry

## 2015-06-12 VITALS — BP 114/63 | HR 81 | Ht 64.5 in | Wt 130.8 lb

## 2015-06-12 DIAGNOSIS — F411 Generalized anxiety disorder: Secondary | ICD-10-CM

## 2015-06-12 DIAGNOSIS — F909 Attention-deficit hyperactivity disorder, unspecified type: Secondary | ICD-10-CM | POA: Diagnosis not present

## 2015-06-12 DIAGNOSIS — F988 Other specified behavioral and emotional disorders with onset usually occurring in childhood and adolescence: Secondary | ICD-10-CM

## 2015-06-12 MED ORDER — SERTRALINE HCL 50 MG PO TABS: ORAL_TABLET | ORAL | Status: DC

## 2015-06-12 MED ORDER — METHYLPHENIDATE HCL ER (OSM) 36 MG PO TBCR: 36.0000 mg | EXTENDED_RELEASE_TABLET | ORAL | Status: DC

## 2015-06-12 NOTE — Progress Notes (Signed)
[facility] [id number] Progress Note  Alexander Wilkerson [id number] 24 y.o.  06/12/2015 10:08 AM  Chief Complaint:  I got a new job last week.  I'm feeling better.    History of Present Illness: Alexander Wilkerson came for his followup appointment with his mother.  He is happy because he is able to get job last week.  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  He is taking his medication for his anxiety and depression and ADD.  He recently seen his endocrinologist and now he is taking metformin because his blood sugar was very high.  He is hoping that blood sugar will go down any scheduled to see Dr. Romero Belling next month.  Patient denies any irritability, anger, mood swing.  He sleeping good.  He still has some time low energy level but he denies any crying spells or any feeling of fatigue.  His appetite is okay.  His vitals are normal.  His attention and concentration is okay.  He has no tremors, shakes or any side effects.  He has blood work done but shows hemoglobin A1c 7.1 .  His TSH is normal.  His CBC shows high hemoglobin but his platelets were normal.  His basic chemistry was normal except for high blood sugar.  Patient denies drinking or using any illegal substances.  Patient lives by himself .  He has no children.  He is happy to start working with a [employer] .  Suicidal Ideation: No Plan Formed: No Patient has means to carry out plan: No  Homicidal Ideation: No Plan Formed: No Patient has means to carry out plan: No  Review of Systems: Psychiatric: Agitation: No Hallucination: No Depressed Mood: No Insomnia: No Hypersomnia: No Altered Concentration: No Feels Worthless: No Grandiose Ideas: No Belief In Special Powers: No New/Increased Substance Abuse: No Compulsions: No  Neurologic: Headache: No Seizure: No Paresthesias: No  Past Medical Family, Social History:  Patient has no active medical problems.  He was diagnosed with diabetes mellitus and used to take  medication however he is on diet control only.  He goes to Dr. Talmage Nap for his medical checkup.  Patient was adopted at a young age.  Patient has no knowledge about his biological parents.  He lived by himself.  He has no children.  He has high school education.  Outpatient Encounter Prescriptions as of 06/12/2015  Medication Sig  . metFORMIN (GLUCOPHAGE-XR) 500 MG 24 hr tablet Take 500 mg by mouth at bedtime.  . methylphenidate 36 MG PO CR tablet Take 1 tablet (36 mg total) by mouth every morning.  . sertraline (ZOLOFT) 50 MG tablet TAKE ONE TABLET BY MOUTH ONCE DAILY  . [DISCONTINUED] methylphenidate 36 MG PO CR tablet Take 1 tablet (36 mg total) by mouth every morning.  . [DISCONTINUED] methylphenidate 36 MG PO CR tablet Take 1 tablet (36 mg total) by mouth every morning.  . [DISCONTINUED] methylphenidate 36 MG PO CR tablet Take 1 tablet (36 mg total) by mouth every morning.  . [DISCONTINUED] sertraline (ZOLOFT) 50 MG tablet TAKE ONE TABLET BY MOUTH ONCE DAILY   No facility-administered encounter medications on file as of 06/12/2015.    Past Psychiatric History/Hospitalization(s): Patient has ADHD.  In the past he had tried Adderall.  He is taking Concerta and Zoloft since 2006 Anxiety: Yes Bipolar Disorder: No Depression: No Mania: No Psychosis: No Schizophrenia: No Personality Disorder: No Hospitalization for psychiatric illness: No History of Electroconvulsive Shock Therapy: No Prior  Suicide Attempts: No  No results found for this or any previous visit (from the past 2160 hour(s)).  Physical Exam: Constitutional:  BP 114/63 mmHg  Pulse 81  Ht 5' 4.5" (1.638 m)  Wt 130 lb 12.8 oz (59.33 kg)  BMI 22.11 kg/m2  General Appearance: alert, oriented, no acute distress and well nourished  Musculoskeletal: Strength & Muscle Tone: within normal limits Gait & Station: normal Patient leans: N/A  Psychiatric Specialty Exam: Physical Exam  Review of Systems  Constitutional:  Negative.   Eyes: Negative for blurred vision.  Cardiovascular: Negative for chest pain and palpitations.  Musculoskeletal: Negative.   Skin: Negative for itching and rash.  Neurological: Negative for dizziness, tingling, tremors and headaches.    Blood pressure 114/63, pulse 81, height 5' 4.5" (1.638 m), weight 130 lb 12.8 oz (59.33 kg).Body mass index is 22.11 kg/(m^2).  General Appearance: Casual  Eye Contact::  Fair  Speech:  Slow  Volume:  Decreased  Mood:  Anxious  Affect:  Congruent  Thought Process:  Coherent  Orientation:  Full (Time, Place, and Person)  Thought Content:  WDL  Suicidal Thoughts:  No  Homicidal Thoughts:  No  Memory:  Immediate;   Good Recent;   Good Remote;   Good  Judgement:  Good  Insight:  Good  Psychomotor Activity:  Normal  Concentration:  Fair  Recall:  Fair  Fund of Knowledge:  Fair  Language:  Good  Akathisia:  No  Handed:  Right  AIMS (if indicated):     Assets:  Communication Skills Desire for Improvement Financial Resources/Insurance Housing  ADL's:  Intact  Cognition:  WNL  Sleep:      Established Problem, Stable/Improving (1), Review of Psycho-Social Stressors (1), Review or order clinical lab tests (1), Review and summation of old records (2), Review of Last Therapy Session (1) and Review of Medication Regimen & Side Effects (2)  Assessment: Axis I: ADD, generalized anxiety disorder  Axis II: Deferred  Axis III: History of diabetes  Plan:  I review his blood work results, psychosocial stressors and his medication.  He is taking metformin 500 mg at bedtime given by endocrinologist.  His hemoglobin A1c is high.  He is hoping to bring down after taking metformin.  At this time he does not have any side effects of medication.  I will continue Zoloft 50 mg daily and Concerta 36 mg daily.  Patient does not have any shakes, tremors, chest pain or palpitation.  Patient is not interested in counseling.  Recommended to call us back if he  has any question, concern.  Symptoms.  I will see him again in 3 months.  Time spent 25 minutes.  More than 50% of the time spent in psychoeducation, counseling, Prentice Docker of care and reviewing collateral information .  Discuss safety plan that anytime having active suicidal thoughts or homicidal thoughts and he need to call 911 or go to the local emergency room.   Jarrah Seher T., MD 06/12/2015

## 2015-09-12 ENCOUNTER — Ambulatory Visit (INDEPENDENT_AMBULATORY_CARE_PROVIDER_SITE_OTHER): Payer: BLUE CROSS/BLUE SHIELD | Admitting: Psychiatry

## 2015-09-12 ENCOUNTER — Encounter (HOSPITAL_COMMUNITY): Payer: Self-pay | Admitting: Psychiatry

## 2015-09-12 VITALS — BP 123/69 | HR 83 | Ht 64.0 in | Wt 143.6 lb

## 2015-09-12 DIAGNOSIS — F988 Other specified behavioral and emotional disorders with onset usually occurring in childhood and adolescence: Secondary | ICD-10-CM

## 2015-09-12 DIAGNOSIS — F900 Attention-deficit hyperactivity disorder, predominantly inattentive type: Secondary | ICD-10-CM | POA: Diagnosis not present

## 2015-09-12 DIAGNOSIS — F411 Generalized anxiety disorder: Secondary | ICD-10-CM

## 2015-09-12 MED ORDER — METHYLPHENIDATE HCL ER (OSM) 36 MG PO TBCR: 36.0000 mg | EXTENDED_RELEASE_TABLET | ORAL | Status: DC

## 2015-09-12 MED ORDER — SERTRALINE HCL 50 MG PO TABS: ORAL_TABLET | ORAL | Status: DC

## 2015-09-12 NOTE — Progress Notes (Signed)
[facility] [id number] Progress Note  Alexander Wilkerson [id number] 24 y.o.  09/12/2015 10:32 AM  Chief Complaint:  Medication management and follow-up.    History of Present Illness: [name] came for his followup appointment.  He is taking Zoloft and Concerta.  He is happy that his job is going very well.  He recently seen his endocrinologist and now he is no longer taking metformin.  He is taking insulin and is happy that his blood sugar is under control.  Patient is scheduled to see his endocrinologist in December.  Overall he described his mood stable.  He denies any irritability, anger, mood swing.  He is happy his parents moved close by .  His energy level is good.  His appetite is okay.  He denies any crying spells or any feeling of hopelessness or worthlessness.  His attention and concentration is good.  He is able to do multitasking.  Patient is working in a [employer].  He likes his job.  Patient denies drinking or using any illegal substances.  He lives by himself.  He has no children.  Suicidal Ideation: No Plan Formed: No Patient has means to carry out plan: No  Homicidal Ideation: No Plan Formed: No Patient has means to carry out plan: No  Review of Systems: Psychiatric: Agitation: No Hallucination: No Depressed Mood: No Insomnia: No Hypersomnia: No Altered Concentration: No Feels Worthless: No Grandiose Ideas: No Belief In Special Powers: No New/Increased Substance Abuse: No Compulsions: No  Neurologic: Headache: No Seizure: No Paresthesias: No  Past Medical Family, Social History:  Patient has diabetes mellitus. He goes to Dr. Talmage Nap for his medical checkup.  Patient was adopted at a young age.  Patient has no knowledge about his biological parents.  He lived by himself.  He has no children.  He has high school education.  Outpatient Encounter Prescriptions as of 09/12/2015  Medication Sig  . methylphenidate 36 MG PO CR tablet Take 1 tablet (36 mg  total) by mouth every morning.  Marland Kitchen NOVOLOG MIX 70/30 FLEXPEN (70-30) 100 UNIT/ML FlexPen   . sertraline (ZOLOFT) 50 MG tablet TAKE ONE TABLET BY MOUTH ONCE DAILY  . [DISCONTINUED] methylphenidate 36 MG PO CR tablet Take 1 tablet (36 mg total) by mouth every morning.  . [DISCONTINUED] methylphenidate 36 MG PO CR tablet Take 1 tablet (36 mg total) by mouth every morning.  . [DISCONTINUED] methylphenidate 36 MG PO CR tablet Take 1 tablet (36 mg total) by mouth every morning.  . [DISCONTINUED] sertraline (ZOLOFT) 50 MG tablet TAKE ONE TABLET BY MOUTH ONCE DAILY  . [DISCONTINUED] metFORMIN (GLUCOPHAGE-XR) 500 MG 24 hr tablet Take 500 mg by mouth at bedtime.   No facility-administered encounter medications on file as of 09/12/2015.    Past Psychiatric History/Hospitalization(s): Patient has ADHD.  In the past he had tried Adderall.  He is taking Concerta and Zoloft since 2006 Anxiety: Yes Bipolar Disorder: No Depression: No Mania: No Psychosis: No Schizophrenia: No Personality Disorder: No Hospitalization for psychiatric illness: No History of Electroconvulsive Shock Therapy: No Prior Suicide Attempts: No  No results found for this or any previous visit (from the past 2160 hour(s)).  Physical Exam: Constitutional:  BP 123/69 mmHg  Pulse 83  Ht  (1.626 m)  Wt 143 lb 9.6 oz (65.137 kg)  BMI 24.64 kg/m2  General Appearance: alert, oriented, no acute distress and well nourished  Musculoskeletal: Strength & Muscle Tone: within normal limits Gait & Station: normal Patient leans: N/A  Psychiatric Specialty Exam: Physical Exam  Review of Systems  Constitutional: Negative.   Eyes: Negative for blurred vision.  Cardiovascular: Negative for chest pain and palpitations.  Musculoskeletal: Negative.   Skin: Negative for itching and rash.  Neurological: Negative for dizziness, tingling, tremors and headaches.    Blood pressure 123/69, pulse 83, height  (1.626 m), weight 143 lb  9.6 oz (65.137 kg).Body mass index is 24.64 kg/(m^2).  General Appearance: Casual  Eye Contact::  Fair  Speech:  Slow  Volume:  Decreased  Mood:  Anxious  Affect:  Congruent  Thought Process:  Coherent  Orientation:  Full (Time, Place, and Person)  Thought Content:  WDL  Suicidal Thoughts:  No  Homicidal Thoughts:  No  Memory:  Immediate;   Good Recent;   Good Remote;   Good  Judgement:  Good  Insight:  Good  Psychomotor Activity:  Normal  Concentration:  Fair  Recall:  Fair  Fund of Knowledge:  Fair  Language:  Good  Akathisia:  No  Handed:  Right  AIMS (if indicated):     Assets:  Communication Skills Desire for Improvement Financial Resources/Insurance Housing  ADL's:  Intact  Cognition:  WNL  Sleep:      Established Problem, Stable/Improving (1), Review of Last Therapy Session (1) and Review of Medication Regimen & Side Effects (2)  Assessment: Axis I: ADD, generalized anxiety disorder  Axis II: Deferred  Axis III: History of diabetes  Plan:  Patient is a stable on Zoloft 50 mg daily and Concerta 36 mg every morning.  He has no side effects including any tremors or shakes.  Discussed medication side effects and benefits.  He does not ask for early refills of stimulant.  Recommended to call us back if he has any question or any concern.  Follow-up in 3 months.   ARFEEN,SYED T., MD 09/12/2015

## 2015-11-17 ENCOUNTER — Encounter: Payer: Self-pay | Admitting: Internal Medicine

## 2015-12-23 ENCOUNTER — Ambulatory Visit (INDEPENDENT_AMBULATORY_CARE_PROVIDER_SITE_OTHER): Payer: BLUE CROSS/BLUE SHIELD | Admitting: Psychiatry

## 2015-12-23 ENCOUNTER — Encounter (HOSPITAL_COMMUNITY): Payer: Self-pay | Admitting: Psychiatry

## 2015-12-23 VITALS — BP 121/80 | HR 105 | Resp 14 | Ht 64.0 in | Wt 151.4 lb

## 2015-12-23 DIAGNOSIS — F411 Generalized anxiety disorder: Secondary | ICD-10-CM | POA: Diagnosis not present

## 2015-12-23 DIAGNOSIS — F988 Other specified behavioral and emotional disorders with onset usually occurring in childhood and adolescence: Secondary | ICD-10-CM

## 2015-12-23 DIAGNOSIS — F909 Attention-deficit hyperactivity disorder, unspecified type: Secondary | ICD-10-CM

## 2015-12-23 MED ORDER — METHYLPHENIDATE HCL ER (OSM) 36 MG PO TBCR: 36.0000 mg | EXTENDED_RELEASE_TABLET | ORAL | Status: DC

## 2015-12-23 MED ORDER — SERTRALINE HCL 50 MG PO TABS: ORAL_TABLET | ORAL | Status: DC

## 2015-12-23 NOTE — Progress Notes (Signed)
[facility] [address] Progress Note  [name] [id number] 25 y.o.  12/23/2015 4:53 PM  Chief Complaint:  Medication management and follow-up.    History of Present Illness: Alexander Wilkerson came for his followup appointment.  He had a quiet Christmas.  He enjoyed holidays with the family.  He is taking his medication as prescribed.  He denies any irritability, anger, mood swing, crying spells or any depressive symptoms.  He reported medicine is working very well.  His attention, focus and motivation is good.  He recently seen his endocrinologist and has an 11 A1c is 8.7.  His appetite is okay.  He lives by himself however his parent lives close by.  Patient denies drinking or using any illegal substances.  He is taking Concerta and Zoloft and denies any side effects including any tremors or shakes.  Patient is working in a [employer].  Suicidal Ideation: No Plan Formed: No Patient has means to carry out plan: No  Homicidal Ideation: No Plan Formed: No Patient has means to carry out plan: No  Review of Systems: Psychiatric: Agitation: No Hallucination: No Depressed Mood: No Insomnia: No Hypersomnia: No Altered Concentration: No Feels Worthless: No Grandiose Ideas: No Belief In Special Powers: No New/Increased Substance Abuse: No Compulsions: No  Neurologic: Headache: No Seizure: No Paresthesias: No  Past Medical Family, Social History:  Patient has diabetes mellitus. He goes to Dr. Talmage Nap for his medical checkup.  Patient was adopted at a young age.  Patient has no knowledge about his biological parents.  He lived by himself.  He has no children.  He has high school education.  Outpatient Encounter Prescriptions as of 12/23/2015  Medication Sig  . methylphenidate 36 MG PO CR tablet Take 1 tablet (36 mg total) by mouth every morning.  . sertraline (ZOLOFT) 50 MG tablet TAKE ONE TABLET BY MOUTH ONCE DAILY  . [DISCONTINUED] methylphenidate 36 MG PO CR tablet Take 1  tablet (36 mg total) by mouth every morning.  . [DISCONTINUED] methylphenidate 36 MG PO CR tablet Take 1 tablet (36 mg total) by mouth every morning.  . [DISCONTINUED] methylphenidate 36 MG PO CR tablet Take 1 tablet (36 mg total) by mouth every morning.  . [DISCONTINUED] sertraline (ZOLOFT) 50 MG tablet TAKE ONE TABLET BY MOUTH ONCE DAILY  . NOVOLOG MIX 70/30 FLEXPEN (70-30) 100 UNIT/ML FlexPen    No facility-administered encounter medications on file as of 12/23/2015.    Past Psychiatric History/Hospitalization(s): Patient has ADHD.  In the past he had tried Adderall.  He is taking Concerta and Zoloft since 2006 Anxiety: Yes Bipolar Disorder: No Depression: No Mania: No Psychosis: No Schizophrenia: No Personality Disorder: No Hospitalization for psychiatric illness: No History of Electroconvulsive Shock Therapy: No Prior Suicide Attempts: No  No results found for this or any previous visit (from the past 2160 hour(s)).  Physical Exam: Constitutional:  BP 121/80 mmHg  Pulse 105  Resp 14  Ht 5\' 4"  (1.626 m)  Wt 151 lb 6.4 oz (68.675 kg)  BMI 25.98 kg/m2  General Appearance: alert, oriented, no acute distress and well nourished  Musculoskeletal: Strength & Muscle Tone: within normal limits Gait & Station: normal Patient leans: N/A  Psychiatric Specialty Exam: Physical Exam  Review of Systems  Constitutional: Negative.   Eyes: Negative for blurred vision.  Cardiovascular: Negative for chest pain and palpitations.  Musculoskeletal: Negative.   Skin: Negative for itching and rash.  Neurological: Negative for dizziness, tingling, tremors and headaches.    Blood pressure  121/80, pulse 105, resp. rate 14, height 5\' 4"  (1.626 m), weight 151 lb 6.4 oz (68.675 kg).Body mass index is 25.98 kg/(m^2).  General Appearance: Casual  Eye Contact::  Fair  Speech:  Slow  Volume:  Decreased  Mood:  Anxious  Affect:  Congruent  Thought Process:  Coherent  Orientation:  Full (Time,  Place, and Person)  Thought Content:  WDL  Suicidal Thoughts:  No  Homicidal Thoughts:  No  Memory:  Immediate;   Good Recent;   Good Remote;   Good  Judgement:  Good  Insight:  Good  Psychomotor Activity:  Normal  Concentration:  Fair  Recall:  Fair  Fund of Knowledge:  Fair  Language:  Good  Akathisia:  No  Handed:  Right  AIMS (if indicated):     Assets:  Communication Skills Desire for Improvement Financial Resources/Insurance Housing  ADL's:  Intact  Cognition:  WNL  Sleep:      Established Problem, Stable/Improving (1), Review of Last Therapy Session (1) and Review of Medication Regimen & Side Effects (2)  Assessment: Axis I: ADD, generalized anxiety disorder  Axis II: Deferred  Axis III: History of diabetes  Plan:  Patient is a stable on Zoloft 50 mg daily and Concerta 36 mg every morning.  He has no side effects including any tremors or shakes.  Discussed medication side effects and benefits.  He does not ask for early refills of stimulant.  Recommended to call us back if he has any question or any concern.  Follow-up in 3 months.   Siyah Mault T., MD 12/23/2015

## 2016-01-22 ENCOUNTER — Encounter: Payer: BLUE CROSS/BLUE SHIELD | Attending: Internal Medicine | Admitting: *Deleted

## 2016-01-22 VITALS — Ht 64.0 in | Wt 153.8 lb

## 2016-01-22 DIAGNOSIS — E119 Type 2 diabetes mellitus without complications: Secondary | ICD-10-CM

## 2016-01-22 NOTE — Patient Instructions (Signed)
Plan:  Aim for 3 Carb Choices per meal (45 grams) +/- 1 either way  Aim for 0-15 Carbs per snack if hungry  Include protein in moderation with your meals and snacks Consider reading food labels for Total Carbohydrate and Fat Grams of foods Consider  increasing your activity level by walking for 30 minutes daily as tolerated Consider checking BG at alternate times per day as directed by MD  Continue taking medication as directed by MD  ALWAYS HAVE PROTEIN WITH OUR CARBOHYDRATES:  Fruit + nuts or peanut butter Waffle + peanut butter or [place] jerky Only one biscuit at [employer] Look at the carbohydrates rather than sugar. Check carbohydrates on the waffles. [facility] Protein Bar Peanut Butter Dark Chocolate  Delores, you are doing a great job! Keep up the good work!

## 2016-01-27 ENCOUNTER — Encounter: Payer: Self-pay | Admitting: *Deleted

## 2016-01-27 NOTE — Progress Notes (Signed)
Diabetes Self-Management Education  Visit Type: First/Initial  Appt. Start Time: 0800 Appt. End Time: 0930  01/27/2016  [name], identified by name and date of birth, is a 25 y.o. male with a diagnosis of Diabetes: Type 2. Alexander Wilkerson presents with his mother Alexander Wilkerson. Alexander Wilkerson has high level functioning Autism. He lives independently, has a job and drives a car. He does his own grocery shopping and only cooks items in the microwave. He eats out with his father regularly. In review of his food choices while eating out we identified some opportunities for improvement. Alexander Wilkerson understands carbohydrate counting and label reading.  ASSESSMENT  Height  (1.626 m), weight 153 lb 12.8 oz (69.763 kg). Body mass index is 26.39 kg/(m^2).      Diabetes Self-Management Education - 01/27/16 1625    Visit Information   Visit Type First/Initial   Initial Visit   Diabetes Type Type 2   Are you currently following a meal plan? No   Are you taking your medications as prescribed? Yes   Date Diagnosed 2011   Health Coping   How would you rate your overall health? Good   Psychosocial Assessment   Patient Belief/Attitude about Diabetes Motivated to manage diabetes   Self-care barriers Other (comment)  Autism-high function level   Self-management support Doctor's office;Family;CDE visits   Other persons present Patient;Parent   Patient Concerns Nutrition/Meal planning;Medication;Monitoring;Healthy Lifestyle;Glycemic Control   Special Needs Other (comment);None  Include family for support and confirmation of information   Preferred Learning Style No preference indicated   Learning Readiness Change in progress   How often do you need to have someone help you when you read instructions, pamphlets, or other written materials from your doctor or pharmacy? 3 - Sometimes   Complications   Last HgB A1C per patient/outside source 8 %   How often do you check your blood sugar? 1-2 times/day   Fasting Blood glucose range (mg/dL) 161-096   Postprandial Blood glucose range (mg/dL) 045-409  /dl   Number of hypoglycemic episodes per month 0   Number of hyperglycemic episodes per week 3   Have you had a dilated eye exam in the past 12 months? No   Have you had a dental exam in the past 12 months? Yes   Are you checking your feet? No   Dietary Intake   Breakfast waffles, banana, bacon, chocolate milk /canadian bacon, grits, biscuit 1, hot chocolate   Lunch Alexander Wilkerson, pork rinds, cookies and diet drink /    Administrator, broccoli, biscuits X2 / chicken, fried ikra, diet soda   Snack (evening) 2 spoonfuls Carb smart Ice cream   Beverage(s) diet soda, hot chocolate, chocolate milk,    Exercise   Exercise Type ADL's   Patient Education   Previous Diabetes Education Yes (please comment)  2012   Nutrition management  Role of diet in the treatment of diabetes and the relationship between the three main macronutrients and blood glucose level;Food label reading, portion sizes and measuring food.;Carbohydrate counting;Meal options for control of blood glucose level and chronic complications.   Physical activity and exercise  Role of exercise on diabetes management, blood pressure control and cardiac health.   Medications Reviewed patients medication for diabetes, action, purpose, timing of dose and side effects.   Monitoring Purpose and frequency of SMBG.   Acute complications Taught treatment of hypoglycemia - the 15 rule.   Chronic complications Relationship between chronic complications and blood glucose control;Reviewed with patient heart  disease, higher risk of, and prevention   Individualized Goals (developed by patient)   Nutrition General guidelines for healthy choices and portions discussed   Physical Activity Exercise 3-5 times per week;30 minutes per day   Medications take my medication as prescribed   Monitoring  test blood glucose pre and post meals as  discussed   Reducing Risk treat hypoglycemia with 15 grams of carbs if blood glucose less than /dL   Outcomes   Expected Outcomes Demonstrated interest in learning. Expect positive outcomes   Future DMSE PRN   Program Status Completed      Individualized Plan for Diabetes Self-Management Training:   Learning Objective:  Patient will have a greater understanding of diabetes self-management. Patient education plan is to attend individual and/or group sessions per assessed needs and concerns.   Patient Instructions  Plan:  Aim for 3 Carb Choices per meal (45 grams) +/- 1 either way  Aim for 0-15 Carbs per snack if hungry  Include protein in moderation with your meals and snacks Consider reading food labels for Total Carbohydrate and Fat Grams of foods Consider  increasing your activity level by walking for 30 minutes daily as tolerated Consider checking BG at alternate times per day as directed by MD  Continue taking medication as directed by MD  ALWAYS HAVE PROTEIN WITH OUR CARBOHYDRATES:  Fruit + nuts or peanut butter Waffle + peanut butter or Alexander Wilkerson Only one biscuit at [employer] Look at the carbohydrates rather than sugar. Check carbohydrates on the waffles. [facility] Protein Bar Peanut Butter Dark Chocolate  Alexander Wilkerson, you are doing a great job! Keep up the good work!  Expected Outcomes:  Demonstrated interest in learning. Expect positive outcomes  Education material provided: Living Well with Diabetes, A1C conversion sheet, Meal plan card and My Plate  If problems or questions, patient to contact team via:  Phone  Future DSME appointment: PRN

## 2016-03-23 ENCOUNTER — Encounter (HOSPITAL_COMMUNITY): Payer: Self-pay | Admitting: Psychiatry

## 2016-03-23 ENCOUNTER — Ambulatory Visit (INDEPENDENT_AMBULATORY_CARE_PROVIDER_SITE_OTHER): Payer: BLUE CROSS/BLUE SHIELD | Admitting: Psychiatry

## 2016-03-23 VITALS — BP 112/76 | HR 83 | Ht 64.5 in | Wt 155.6 lb

## 2016-03-23 DIAGNOSIS — F909 Attention-deficit hyperactivity disorder, unspecified type: Secondary | ICD-10-CM

## 2016-03-23 DIAGNOSIS — F411 Generalized anxiety disorder: Secondary | ICD-10-CM | POA: Diagnosis not present

## 2016-03-23 DIAGNOSIS — F988 Other specified behavioral and emotional disorders with onset usually occurring in childhood and adolescence: Secondary | ICD-10-CM

## 2016-03-23 MED ORDER — SERTRALINE HCL 50 MG PO TABS: ORAL_TABLET | ORAL | Status: DC

## 2016-03-23 MED ORDER — METHYLPHENIDATE HCL ER (OSM) 36 MG PO TBCR: 36.0000 mg | EXTENDED_RELEASE_TABLET | ORAL | Status: DC

## 2016-03-23 NOTE — Progress Notes (Signed)
[facility] [id number] Progress Note  Alexander Wilkerson [id number] 25 y.o.  03/23/2016 4:26 PM  Chief Complaint:  Medication management and follow-up.    History of Present Illness: Alexander Wilkerson came for his followup appointment.  He is taking his medication as prescribed.  He continues to work at [employer] and he likes his job.  He is taking Zoloft and Concerta.  He denies any irritability, anger, mood swing.  He is able to do multitasking.  His attention concentration is okay.  He has no side effects including any insomnia, palpitation, tremors or shakes.  He is seeing his endocrinologist on a regular basis.  Now he is using sensor was connected to his device and he check his blood sugar every day.  He had appointment to see Dr. Lisabeth Devoid on May 4th.  Patient denies drinking or using any illegal substances .  His energy level is good.  His appetite is okay.  His vitals are stable.    Suicidal Ideation: No Plan Formed: No Patient has means to carry out plan: No  Homicidal Ideation: No Plan Formed: No Patient has means to carry out plan: No  Review of Systems: Psychiatric: Agitation: No Hallucination: No Depressed Mood: No Insomnia: No Hypersomnia: No Altered Concentration: No Feels Worthless: No Grandiose Ideas: No Belief In Special Powers: No New/Increased Substance Abuse: No Compulsions: No  Neurologic: Headache: No Seizure: No Paresthesias: No  Past Medical Family, Social History:  Patient has diabetes mellitus. He goes to Dr. Talmage Nap for his medical checkup.  Patient was adopted at a young age.  Patient has no knowledge about his biological parents.  He lived by himself.  He has no children.  He has high school education.  Outpatient Encounter Prescriptions as of 03/23/2016  Medication Sig  . methylphenidate 36 MG PO CR tablet Take 1 tablet (36 mg total) by mouth every morning.  Marland Kitchen NOVOLOG MIX 70/30 FLEXPEN (70-30) 100 UNIT/ML FlexPen   . sertraline (ZOLOFT) 50 MG  tablet TAKE ONE TABLET BY MOUTH ONCE DAILY  . [DISCONTINUED] methylphenidate 36 MG PO CR tablet Take 1 tablet (36 mg total) by mouth every morning.  . [DISCONTINUED] methylphenidate 36 MG PO CR tablet Take 1 tablet (36 mg total) by mouth every morning.  . [DISCONTINUED] methylphenidate 36 MG PO CR tablet Take 1 tablet (36 mg total) by mouth every morning.  . [DISCONTINUED] methylphenidate 36 MG PO CR tablet Take 1 tablet (36 mg total) by mouth every morning.  . [DISCONTINUED] sertraline (ZOLOFT) 50 MG tablet TAKE ONE TABLET BY MOUTH ONCE DAILY   No facility-administered encounter medications on file as of 03/23/2016.    Past Psychiatric History/Hospitalization(s): Patient has ADHD.  In the past he had tried Adderall.  He is taking Concerta and Zoloft since 2006 Anxiety: Yes Bipolar Disorder: No Depression: No Mania: No Psychosis: No Schizophrenia: No Personality Disorder: No Hospitalization for psychiatric illness: No History of Electroconvulsive Shock Therapy: No Prior Suicide Attempts: No  No results found for this or any previous visit (from the past 2160 hour(s)).  Physical Exam: Constitutional:  BP 112/76 mmHg  Pulse 83  Ht 5' 4.5" (1.638 m)  Wt 155 lb 9.6 oz (70.58 kg)  BMI 26.31 kg/m2  General Appearance: alert, oriented, no acute distress and well nourished  Musculoskeletal: Strength & Muscle Tone: within normal limits Gait & Station: normal Patient leans: N/A  Psychiatric Specialty Exam: Physical Exam  Review of Systems  Constitutional: Negative.   Eyes: Negative for blurred vision.  Cardiovascular: Negative for chest pain and palpitations.  Musculoskeletal: Negative.   Skin: Negative for itching and rash.  Neurological: Negative for dizziness, tingling, tremors and headaches.    Blood pressure 112/76, pulse 83, height 5' 4.5" (1.638 m), weight 155 lb 9.6 oz (70.58 kg).Body mass index is 26.31 kg/(m^2).  General Appearance: Casual  Eye Contact::  Fair   Speech:  Slow  Volume:  Decreased  Mood:  Anxious  Affect:  Congruent  Thought Process:  Coherent  Orientation:  Full (Time, Place, and Person)  Thought Content:  WDL  Suicidal Thoughts:  No  Homicidal Thoughts:  No  Memory:  Immediate;   Good Recent;   Good Remote;   Good  Judgement:  Good  Insight:  Good  Psychomotor Activity:  Normal  Concentration:  Fair  Recall:  Fair  Fund of Knowledge:  Fair  Language:  Good  Akathisia:  No  Handed:  Right  AIMS (if indicated):     Assets:  Communication Skills Desire for Improvement Financial Resources/Insurance Housing  ADL's:  Intact  Cognition:  WNL  Sleep:      Established Problem, Stable/Improving (1), Review of Last Therapy Session (1) and Review of Medication Regimen & Side Effects (2)  Assessment: Axis I: ADD, generalized anxiety disorder  Axis II: Deferred  Axis III: History of diabetes  Plan:  Patient is a stable on Zoloft 50 mg daily and Concerta 36 mg every morning.  He reported no side effects.  Discussed medication side effects and benefits.  He does not ask for early refills of stimulant.  Recommended to call us back if he has any question or any concern.  Follow-up in 3 months.   Meiko Stranahan T., MD 03/23/2016

## 2016-04-22 DIAGNOSIS — E1065 Type 1 diabetes mellitus with hyperglycemia: Secondary | ICD-10-CM | POA: Diagnosis not present

## 2016-05-06 DIAGNOSIS — E1065 Type 1 diabetes mellitus with hyperglycemia: Secondary | ICD-10-CM | POA: Diagnosis not present

## 2016-06-29 ENCOUNTER — Telehealth (HOSPITAL_COMMUNITY): Payer: Self-pay

## 2016-06-29 DIAGNOSIS — F411 Generalized anxiety disorder: Secondary | ICD-10-CM

## 2016-06-29 DIAGNOSIS — F988 Other specified behavioral and emotional disorders with onset usually occurring in childhood and adolescence: Secondary | ICD-10-CM

## 2016-06-29 MED ORDER — SERTRALINE HCL 50 MG PO TABS: ORAL_TABLET | ORAL | Status: DC

## 2016-06-29 MED ORDER — METHYLPHENIDATE HCL ER (OSM) 36 MG PO TBCR: 36.0000 mg | EXTENDED_RELEASE_TABLET | ORAL | Status: DC

## 2016-06-29 NOTE — Telephone Encounter (Signed)
Patient is calling because he had to reschedule his follow up and will run out of medication. Dr. Michae KavaAgarwal approved a one month order on his Zoloft and Concerta. This was sent to pharmacy and patient was called to come and pick up rx for concerta.

## 2016-06-30 ENCOUNTER — Telehealth (HOSPITAL_COMMUNITY): Payer: Self-pay

## 2016-06-30 NOTE — Telephone Encounter (Signed)
Montez MoritaCarter picked up prescription on 1/61/097/11/17  lic [id number]  dlo

## 2016-07-06 ENCOUNTER — Ambulatory Visit (HOSPITAL_COMMUNITY): Payer: Self-pay | Admitting: Psychiatry

## 2016-07-19 DIAGNOSIS — E1065 Type 1 diabetes mellitus with hyperglycemia: Secondary | ICD-10-CM | POA: Diagnosis not present

## 2016-07-28 DIAGNOSIS — E1065 Type 1 diabetes mellitus with hyperglycemia: Secondary | ICD-10-CM | POA: Diagnosis not present

## 2016-07-29 ENCOUNTER — Telehealth (HOSPITAL_COMMUNITY): Payer: Self-pay

## 2016-07-29 ENCOUNTER — Other Ambulatory Visit (HOSPITAL_COMMUNITY): Payer: Self-pay | Admitting: Psychiatry

## 2016-07-29 DIAGNOSIS — F988 Other specified behavioral and emotional disorders with onset usually occurring in childhood and adolescence: Secondary | ICD-10-CM

## 2016-07-29 DIAGNOSIS — F411 Generalized anxiety disorder: Secondary | ICD-10-CM

## 2016-07-29 MED ORDER — METHYLPHENIDATE HCL ER (OSM) 36 MG PO TBCR: 36.0000 mg | EXTENDED_RELEASE_TABLET | ORAL | 0 refills | Status: DC

## 2016-07-29 NOTE — Telephone Encounter (Signed)
Yes we can refill Concerta

## 2016-07-29 NOTE — Telephone Encounter (Signed)
4:41pm 07/29/16 Pt WG#[id number] came and pickup rx script/sh

## 2016-07-29 NOTE — Telephone Encounter (Signed)
Patient has an appointment with Dr. Lolly MustacheArfeen later this month, but will be out of his Concerta, okay to refill? Please review and advise, thank you

## 2016-07-29 NOTE — Telephone Encounter (Signed)
Printed and called patient to come and pick up 

## 2016-08-02 ENCOUNTER — Other Ambulatory Visit (HOSPITAL_COMMUNITY): Payer: Self-pay

## 2016-08-02 DIAGNOSIS — F411 Generalized anxiety disorder: Secondary | ICD-10-CM

## 2016-08-02 MED ORDER — SERTRALINE HCL 50 MG PO TABS: ORAL_TABLET | ORAL | 0 refills | Status: DC

## 2016-08-11 ENCOUNTER — Ambulatory Visit (INDEPENDENT_AMBULATORY_CARE_PROVIDER_SITE_OTHER): Payer: BLUE CROSS/BLUE SHIELD | Admitting: Psychiatry

## 2016-08-11 ENCOUNTER — Encounter (HOSPITAL_COMMUNITY): Payer: Self-pay | Admitting: Psychiatry

## 2016-08-11 DIAGNOSIS — F988 Other specified behavioral and emotional disorders with onset usually occurring in childhood and adolescence: Secondary | ICD-10-CM

## 2016-08-11 DIAGNOSIS — F411 Generalized anxiety disorder: Secondary | ICD-10-CM

## 2016-08-11 DIAGNOSIS — F909 Attention-deficit hyperactivity disorder, unspecified type: Secondary | ICD-10-CM | POA: Diagnosis not present

## 2016-08-11 MED ORDER — METHYLPHENIDATE HCL ER (OSM) 36 MG PO TBCR: 36.0000 mg | EXTENDED_RELEASE_TABLET | ORAL | 0 refills | Status: DC

## 2016-08-11 MED ORDER — METHYLPHENIDATE HCL ER (OSM) 36 MG PO TBCR
36.0000 mg | EXTENDED_RELEASE_TABLET | ORAL | 0 refills | Status: DC
Start: 2016-08-11 — End: 2016-08-11

## 2016-08-11 MED ORDER — SERTRALINE HCL 50 MG PO TABS
ORAL_TABLET | ORAL | 2 refills | Status: DC
Start: 2016-08-11 — End: 2016-11-23

## 2016-08-11 NOTE — Progress Notes (Signed)
BH MD/PA/NP OP Progress Note  08/11/2016 3:38 PM Alexander Wilkerson  MRN:  [id number]  Chief Complaint: I am doing good Subjective:  meds are fine  HPI: doing well.  No new complaints.  Focus is good.no significant anxiety or depression Visit Diagnosis:    ICD-9-CM ICD-10-CM   1. Attention deficit disorder 314.00 F90.9 methylphenidate 36 MG PO CR tablet     DISCONTINUED: methylphenidate 36 MG PO CR tablet     DISCONTINUED: methylphenidate 36 MG PO CR tablet  2. Generalized anxiety disorder 300.02 F41.1 sertraline (ZOLOFT) 50 MG tablet    Past Psychiatric History: no changes  Past Medical History:  Past Medical History:  Diagnosis Date  . Autism spectrum disorder   . Diabetes mellitus, type II [facility](HCC)     Past Surgical History:  Procedure Laterality Date  . WISDOM TOOTH EXTRACTION      Family Psychiatric History: no changes  Family History: No family history on file.  Social History:  Social History   Social History  . Marital status: Single    Spouse name: N/A  . Number of children: N/A  . Years of education: N/A   Social History Main Topics  . Smoking status: Never Smoker  . Smokeless tobacco: None  . Alcohol use No  . Drug use: No  . Sexual activity: Not Asked   Other Topics Concern  . None   Social History Narrative  . None    Allergies: No Known Allergies  Metabolic Disorder Labs: Lab Results  Component Value Date   HGBA1C 7.7 (H) 03/08/2015   MPG 174 (H) 03/08/2015   MPG 105 02/23/2014   No results found for: PROLACTIN Lab Results  Component Value Date   CHOL 138 02/23/2014   TRIG 56 02/23/2014   HDL 37 (L) 02/23/2014   CHOLHDL 3.7 02/23/2014   VLDL 11 02/23/2014   LDLCALC 90 02/23/2014     Current Medications: Current Outpatient Prescriptions  Medication Sig Dispense Refill  . methylphenidate 36 MG PO CR tablet Take 1 tablet (36 mg total) by mouth every morning. 30 tablet 0  . NOVOLOG MIX 70/30 FLEXPEN (70-30) 100 UNIT/ML FlexPen   0  .  sertraline (ZOLOFT) 50 MG tablet TAKE ONE TABLET BY MOUTH ONCE DAILY 30 tablet 2   No current facility-administered medications for this visit.     Neurologic: Headache: Negative Seizure: Negative Paresthesias: Negative  Musculoskeletal: Strength & Muscle Tone: within normal limits Gait & Station: normal Patient leans: N/A  Psychiatric Specialty Exam: ROS  Blood pressure 112/64, pulse 78, height 5\' 4"  (1.626 m), weight 155 lb 3.2 oz (70.4 kg).Body mass index is 26.64 kg/m.  General Appearance: Casual  Eye Contact:  Good  Speech:  Clear and Coherent  Volume:  Normal  Mood:  Euthymic  Affect:  Appropriate  Thought Process:  Coherent  Orientation:  Full (Time, Place, and Person)  Thought Content: Logical   Suicidal Thoughts:  No  Homicidal Thoughts:  No  Memory:  Immediate;   Good Recent;   Good Remote;   Good  Judgement:  Good  Insight:  Good  Psychomotor Activity:  Normal  Concentration:  Concentration: Good and Attention Span: Good  Recall:  Good  Fund of Knowledge: Good  Language: Good  Akathisia:  Negative  Handed:  Right  AIMS (if indicated):  0  Assets:  Communication Skills Desire for Improvement Financial Resources/Insurance Housing Intimacy Leisure Time Physical Health Resilience Social Support Talents/Skills Transportation Vocational/Educational  ADL's:  Intact  Cognition: WNL  Sleep:  good     Treatment Plan Summary:ADHD:  Doing well.  Continue Concerta 36 mg daily Anxiety:  Doing well continue sertraline 50 mg daily Return 3 months   Carolanne GrumblingGerald Taylor, MD 08/11/2016, 3:38 PM

## 2016-10-21 DIAGNOSIS — E1065 Type 1 diabetes mellitus with hyperglycemia: Secondary | ICD-10-CM | POA: Diagnosis not present

## 2016-10-28 DIAGNOSIS — E1065 Type 1 diabetes mellitus with hyperglycemia: Secondary | ICD-10-CM | POA: Diagnosis not present

## 2016-11-15 ENCOUNTER — Ambulatory Visit (HOSPITAL_COMMUNITY): Payer: Self-pay | Admitting: Psychiatry

## 2016-11-15 ENCOUNTER — Ambulatory Visit (HOSPITAL_COMMUNITY)
Admission: RE | Admit: 2016-11-15 | Discharge: 2016-11-15 | Disposition: A | Discharge status: Home / Self Care | Payer: BLUE CROSS/BLUE SHIELD | Attending: Psychiatry | Admitting: Psychiatry

## 2016-11-15 DIAGNOSIS — E108 Type 1 diabetes mellitus with unspecified complications: Secondary | ICD-10-CM | POA: Diagnosis not present

## 2016-11-15 DIAGNOSIS — Z794 Long term (current) use of insulin: Secondary | ICD-10-CM | POA: Diagnosis not present

## 2016-11-15 DIAGNOSIS — F840 Autistic disorder: Secondary | ICD-10-CM | POA: Diagnosis not present

## 2016-11-15 DIAGNOSIS — F909 Attention-deficit hyperactivity disorder, unspecified type: Secondary | ICD-10-CM | POA: Diagnosis not present

## 2016-11-15 NOTE — BH Assessment (Addendum)
Tele Assessment Note   Alexander Wilkerson is an 25 y.o. male brought in voluntarily by has parents as a [facility]. Pt gave permission for his parents (Ginger & Imagene ShellerSteve Dezeeuw) to be present and participate in his assessment. Pt denies SI, HI, SHI and AVH. Pt sts he sought treatment due to an incident over the weekend in which he exposed himself to his 27 yo sister. Pt has a hx of exposing himself to his sister that started at about age 25 yo. Pt sts he has exposed himself only to her and a total of 3 times counting this weekend.  Pt has previously been diagnosed with ADHD, GAD and Autism. Per mom, pt has a LD but Full Scale IQ is in the 90 range (with wide scatter in testing.) Pt began OPT with [facility] after the first incident when pt was approximately 25 yo and continued tx for about 2 years.  Pt's sister was 27 yo at that time of the first incident and CPS/DSS was involved with the family at that time due to the pt's behavior.   Pt lives alone and independently with his parents living about 5 minutes away. 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  Pt sees dr. Kathryne SharperSyed Arfeen for medication management but does not currently see anyone for OPT. Pt has never been psychiatrically hospitalized. Pt denies any abuse: physical, verbal or sexual. Pt sts his only stressors are managing his finances and managing his Diabetes 1 condition. Pt was diagnosed about 3-4 years ago and is insulin dependent. Pt denies any legal hx and denies any severe anger issues. Pt denies access to guns. Pt sts he sleeps about 8 hours each night and eats well and regularly with weight relatively stable. Pt denies all symptoms of depression currently. Pt sts his anxiety symptoms usually involve social anxiety and changes in  routine. Pt denies any alcohol or substances use.   Pt was dressed in appropriate, modest street clothes. Pt was alert, cooperative and pleasant. Pt kept fair eye contact, spoke in a soft tone and at a normal pace. Pt moved in a normal manner when moving. Pt's thought process was coherent and relevant and judgement was unimpaired. Pt's thinking was primarily concrete in nature. No indication of delusional thinking or response to internal stimuli. Pt's mood was stated as neither depressed or overly anxious and his euthymic affect was congruent.  Pt was oriented x 4, to person, place, time and situation.   Diagnosis: ASD by hx; ADHD by hx; GAD by hx  Past Medical History:  Past Medical History:  Diagnosis Date  . Autism spectrum disorder   . Diabetes mellitus, type II [facility](HCC)     Past Surgical History:  Procedure Laterality Date  . WISDOM TOOTH EXTRACTION      Family History: No family history on file.  Social History:  reports that he has never smoked. He does not have any smokeless tobacco history on file. He reports that he does not drink alcohol or use drugs.  Additional Social History:  Alcohol / Drug Use Prescriptions: Concerta 36 mg, Zoloft 50 mg, Novolog 70/30 Flex mix History of alcohol / drug use?: No history of alcohol / drug abuse  CIWA:   COWS:    PATIENT STRENGTHS: (choose at least two)  Average or above average intelligence Capable of independent living Communication skills Supportive family/friends  Allergies: No Known Allergies  Home Medications:  (Not in a hospital admission)  OB/GYN Status:  No LMP for male patient.  General Assessment Data [facility] of Assessment: [facility] TTS Assessment: In system Is this a Tele or Face-to-Face Assessment?: Face-to-Face Is this an Initial Assessment or a Re-assessment for this encounter?: Initial Assessment Marital status: Single Maiden name:  (na) Is patient pregnant?: No Pregnancy Status:  No Living Arrangements: Alone Can pt return to current living arrangement?: Yes Admission Status: Voluntary Is patient capable of signing voluntary admission?: Yes Referral Source: Self/Family/Friend Insurance type:  Herbalist(BCBS)  Medical Screening Exam [facility] Walk-[facility] ONLY) Medical Exam completed: Yes (MSE by Donell SievertSpencer Simon, PA)  Crisis Care Plan Living Arrangements: Alone Legal Guardian:  (self) Name of Psychiatrist:  (Dr. Kathryne SharperSyed Arfeen) Name of Therapist:  (none currently)  Education Status Is patient currently in school?: No Highest grade of school patient has completed:  (HS graduate)  Risk to self with the past 6 months Suicidal Ideation: No (denies) Has patient been a risk to self within the past 6 months prior to admission? : No Suicidal Intent: No Has patient had any suicidal intent within the past 6 months prior to admission? : No Is patient at risk for suicide?: No Suicidal Plan?: No Has patient had any suicidal plan within the past 6 months prior to admission? : No Access to Means: No (sts no access to guns) What has been your use of drugs/alcohol within the last 12 months?:  (none) Previous Attempts/Gestures: No (denies) How many times?:  (0) Other Self Harm Risks:  (none reported) Triggers for Past Attempts: None known Intentional Self Injurious Behavior: None Family Suicide History: Unknown (pt adopted as an infant) Recent stressful life event(s): Financial Problems, Recent negative physical changes, Other (Comment) (Diabetes I- late onset; managing finances) Persecutory voices/beliefs?: No Depression: No Depression Symptoms:  (denies all symptoms-taking Zoloft as prescribed) Substance abuse history and/or treatment for substance abuse?: No Suicide prevention information given to non-admitted patients: Not applicable  Risk to Others within the past 6 months Homicidal Ideation: No (denies) Does patient have any lifetime risk of violence toward others beyond the six  months prior to admission? : No (none reported) Thoughts of Harm to Others: No (denies) Current Homicidal Intent: No Current Homicidal Plan: No Access to Homicidal Means: No Identified Victim:  (none ) History of harm to others?: No Assessment of Violence: None Noted Does patient have access to weapons?: No Criminal Charges Pending?: No (sts no legal hx) Does patient have a court date: No Is patient on probation?: No  Psychosis Hallucinations: None noted (denies) Delusions: None noted  Mental Status Report Appearance/Hygiene: Disheveled, Unremarkable Eye Contact: Fair Motor Activity: Freedom of movement Speech: Soft Level of Consciousness: Alert Mood: Pleasant, Euthymic Affect: Appropriate to circumstance Anxiety Level: Minimal Thought Processes: Coherent, Relevant (Mostly concrete thinking) Judgement: Unimpaired Orientation: Person, Place, Time, Situation Obsessive Compulsive Thoughts/Behaviors: None (none reported)  Cognitive Functioning Concentration: Fair Memory: Recent Intact, Remote Intact IQ: Average (Per mom, over 70- in 90 range) Insight: Fair Impulse Control: Poor (exposed himself to his 27 yo sister this w/e) Appetite: Good Weight Loss:  (0) Weight Gain:  (0) Sleep: No Change Total Hours of Sleep:  (8) Vegetative Symptoms: None  ADLScreening [facility]) Patient's cognitive ability adequate to safely complete daily activities?: Yes Patient able to express need for assistance with ADLs?: Yes Independently performs ADLs?:  Yes (appropriate for developmental age)  Prior Inpatient Therapy Prior Inpatient Therapy: No  Prior Outpatient Therapy Prior Outpatient Therapy: Yes Prior Therapy Dates:  (about 10 years ago) Prior Therapy Facilty/Provider(s):  [facility] Provost [facility]) Reason for Treatment:  (Exposing himself to sister; Autism- social skills, com) Does patient have an ACCT team?: No Does patient have Intensive In-House  Services?  : No Does patient have [facility] services? : No Does patient have P4CC services?: No  ADL Screening (condition at time of admission) Patient's cognitive ability adequate to safely complete daily activities?: Yes Patient able to express need for assistance with ADLs?: Yes Independently performs ADLs?: Yes (appropriate for developmental age)       Abuse/Neglect Assessment (Assessment to be complete while patient is alone) Physical Abuse: Denies Verbal Abuse: Denies Sexual Abuse: Denies Exploitation of patient/patient's resources: Denies Self-Neglect: Denies     Merchant navy officer (For Healthcare) Does Patient Have a Medical Advance Directive?: No Would patient like information on creating a medical advance directive?: No - Patient declined    Additional Information 1:1 In Past 12 Months?: No CIRT Risk: No Elopement Risk: No Does patient have medical clearance?: No (had MSE from Donell Sievert, [facility] at [facility])     Disposition:  Disposition Initial Assessment Completed for this Encounter: Yes Disposition of Patient: Outpatient treatment (Per Donell Sievert, PA) Type of outpatient treatment: Adult (Gave pt OP Resources list for Providers for Autistic pts)  Mckinzey Entwistle T 11/15/2016 9:31 PM

## 2016-11-15 NOTE — H&P (Signed)
Behavioral Health Medical Screening Exam  Alexander Wilkerson is an 25 y.o. male.Accompanied with his parents, seeking assistance with out-patient resources for psychotherapeutic intervention in patient with hx of autism and ADHD. There is no reported hx of lethality  Total Time spent with patient: 30 minutes  Psychiatric Specialty Exam: Physical Exam  Constitutional: He is oriented to person, place, and time. He appears well-developed. No distress.  HENT:  Head: Normocephalic.  Eyes: Pupils are equal, round, and reactive to light.  Neurological: He is alert and oriented to person, place, and time. No cranial nerve deficit.  Skin: Skin is warm and dry. He is not diaphoretic.  Psychiatric: He has a normal mood and affect. His speech is normal. Thought content normal. Cognition and memory are impaired. He expresses impulsivity.    Review of Systems  Psychiatric/Behavioral: Negative for depression, hallucinations, substance abuse and suicidal ideas. The patient does not have insomnia.   All other systems reviewed and are negative.   There were no vitals taken for this visit.There is no height or weight on file to calculate BMI.  General Appearance: Casual  Eye Contact:  Fair  Speech:  Clear and Coherent  Volume:  Normal  Mood:  Euthymic  Affect:  Congruent  Thought Process:  Coherent  Orientation:  Full (Time, Place, and Person)  Thought Content:  Negative  Suicidal Thoughts:  No  Homicidal Thoughts:  No  Memory:  Immediate;   Fair  Judgement:  Poor  Insight:  Lacking  Psychomotor Activity:  Negative  Concentration: Concentration: Fair  Recall:  Poor  Fund of Knowledge:Good  Language: Good  Akathisia:  Negative  Handed:  Right  AIMS (if indicated):     Assets:  Desire for Improvement  Sleep:       Musculoskeletal: Strength & Muscle Tone: within normal limits Gait & Station: normal Patient leans: N/A  There were no vitals taken for this visit.  Recommendations:  Based  on my evaluation the patient does not appear to have an emergency medical condition.  Artice Holohan E, PA-C 11/15/2016, 9:50 PM

## 2016-11-23 ENCOUNTER — Other Ambulatory Visit (HOSPITAL_COMMUNITY): Payer: Self-pay | Admitting: Psychiatry

## 2016-11-23 DIAGNOSIS — F411 Generalized anxiety disorder: Secondary | ICD-10-CM

## 2016-11-26 ENCOUNTER — Telehealth (HOSPITAL_COMMUNITY): Payer: Self-pay

## 2016-11-26 ENCOUNTER — Other Ambulatory Visit (HOSPITAL_COMMUNITY): Payer: Self-pay | Admitting: Psychiatry

## 2016-11-26 DIAGNOSIS — F900 Attention-deficit hyperactivity disorder, predominantly inattentive type: Secondary | ICD-10-CM

## 2016-11-26 MED ORDER — METHYLPHENIDATE HCL ER (OSM) 36 MG PO TBCR: 36.0000 mg | EXTENDED_RELEASE_TABLET | ORAL | 0 refills | Status: DC

## 2016-11-26 NOTE — Telephone Encounter (Signed)
Patient has a follow up on 1/3 but will be out of his medication this weekend. He would like a refill on the Concerta, I already sent in one month of the Zoloft. Please review and advise, thank you

## 2016-11-29 ENCOUNTER — Telehealth (HOSPITAL_COMMUNITY): Payer: Self-pay | Admitting: Psychiatry

## 2016-11-29 NOTE — Telephone Encounter (Signed)
Alexander Wilkerson picked up prescription on 16/09/9611/11/17 lic [id number] dlo

## 2016-11-29 NOTE — Telephone Encounter (Signed)
Dr. Lolly MustacheArfeen printed and signed prescription, I called patient to let him know it was ready for pick up.

## 2016-12-07 DIAGNOSIS — F840 Autistic disorder: Secondary | ICD-10-CM | POA: Diagnosis not present

## 2016-12-21 DIAGNOSIS — F840 Autistic disorder: Secondary | ICD-10-CM | POA: Diagnosis not present

## 2016-12-22 ENCOUNTER — Encounter (HOSPITAL_COMMUNITY): Payer: Self-pay | Admitting: Psychiatry

## 2016-12-22 ENCOUNTER — Ambulatory Visit (INDEPENDENT_AMBULATORY_CARE_PROVIDER_SITE_OTHER): Payer: BLUE CROSS/BLUE SHIELD | Admitting: Psychiatry

## 2016-12-22 DIAGNOSIS — Z79899 Other long term (current) drug therapy: Secondary | ICD-10-CM | POA: Diagnosis not present

## 2016-12-22 DIAGNOSIS — F900 Attention-deficit hyperactivity disorder, predominantly inattentive type: Secondary | ICD-10-CM | POA: Diagnosis not present

## 2016-12-22 DIAGNOSIS — F411 Generalized anxiety disorder: Secondary | ICD-10-CM | POA: Diagnosis not present

## 2016-12-22 MED ORDER — METHYLPHENIDATE HCL ER (OSM) 36 MG PO TBCR: 36.0000 mg | EXTENDED_RELEASE_TABLET | ORAL | 0 refills | Status: DC

## 2016-12-22 MED ORDER — SERTRALINE HCL 50 MG PO TABS: 50.0000 mg | ORAL_TABLET | Freq: Every day | ORAL | 2 refills | Status: DC

## 2016-12-22 NOTE — Progress Notes (Signed)
BH MD/PA/NP OP Progress Note  12/22/2016 2:35 PM ZANNIE RUNKLE  MRN:  [id number]  Chief Complaint:  Chief Complaint    Follow-up     Subjective:  I'm doing fine.  But I have a incident at Thanksgiving.  I exposed myself to my sister.  HPI: Kouper came for his follow-up appointment with his mother.  He had an incident last Thanksgiving when he exposed himself in front of his 28 year old sister.  Her sister is visiting from [place] where she studies.  She is living with her parents in [place] lives by himself.  Patient does not remember very well what triggered this incident.  His mother told that patient has history of exposing himself 7 years ago.  [facility] however he does not meet criteria for inpatient services.  He is now seeing therapist April Foresberry who have expertise to treat people with autism and impulsive behavior.  Since then patient is not involved in any inappropriate behavior.  He feels guilty about his behavior.  He apologizes with his sister.  Mother also endorse that he is doing well since then.  Patient has history of ADHD and autism.  He sleeping good.  He denies any irritability, mania, psychosis or any hallucination.  Denies any paranoia or any panic attack.  Patient denies drinking alcohol or using any illegal substances.  His appetite is okay.  His vital signs are stable.  Patient recently seen his endocrinologist and his hemoglobin A1c was 7.7.  Visit Diagnosis:    ICD-9-CM ICD-10-CM   1. Generalized anxiety disorder 300.02 F41.1 sertraline (ZOLOFT) 50 MG tablet  2. Attention deficit hyperactivity disorder (ADHD), predominantly inattentive type 314.00 F90.0 methylphenidate 36 MG PO CR tablet     DISCONTINUED: methylphenidate 36 MG PO CR tablet    Past Psychiatric History: Reviewed  Past Medical History:  Past Medical History:  Diagnosis Date  . Autism spectrum disorder   . Diabetes mellitus, type II [facility])      Past Surgical History:  Procedure Laterality Date  . WISDOM TOOTH EXTRACTION      Family Psychiatric History: Reviewed  Family History: No family history on file.  Social History:  Social History   Social History  . Marital status: Single    Spouse name: N/A  . Number of children: N/A  . Years of education: N/A   Social History Main Topics  . Smoking status: Never Smoker  . Smokeless tobacco: None  . Alcohol use No  . Drug use: No  . Sexual activity: Not Asked   Other Topics Concern  . None   Social History Narrative  . None    Allergies: No Known Allergies  Metabolic Disorder Labs: Lab Results  Component Value Date   HGBA1C 7.7 (H) 03/08/2015   MPG 174 (H) 03/08/2015   MPG 105 02/23/2014   No results found for: PROLACTIN Lab Results  Component Value Date   CHOL 138 02/23/2014   TRIG 56 02/23/2014   HDL 37 (L) 02/23/2014   CHOLHDL 3.7 02/23/2014   VLDL 11 02/23/2014   LDLCALC 90 02/23/2014     Current Medications: Current Outpatient Prescriptions  Medication Sig Dispense Refill  . methylphenidate 36 MG PO CR tablet Take 1 tablet (36 mg total) by mouth every morning. 30 tablet 0  . NOVOLOG MIX 70/30 FLEXPEN (70-30) 100 UNIT/ML FlexPen   0  . sertraline (ZOLOFT) 50 MG tablet Take 1 tablet (50 mg total) by  mouth daily. 30 tablet 2   No current facility-administered medications for this visit.     Neurologic: Headache: No Seizure: No Paresthesias: No  Musculoskeletal: Strength & Muscle Tone: within normal limits Gait & Station: normal Patient leans: N/A  Psychiatric Specialty Exam: ROS  Blood pressure 122/70, pulse 92, height 5\' 5"  (1.651 m), weight 157 lb 9.6 oz (71.5 kg).Body mass index is 26.23 kg/m.  General Appearance: Casual  Eye Contact:  Fair  Speech:  Slow  Volume:  Decreased  Mood:  Anxious  Affect:  Appropriate  Thought Process:  Goal Directed  Orientation:  Full (Time, Place, and Person)  Thought Content: WDL and  Logical   Suicidal Thoughts:  No  Homicidal Thoughts:  No  Memory:  Immediate;   Good Recent;   Good Remote;   Good  Judgement:  Good  Insight:  Good  Psychomotor Activity:  Normal  Concentration:  Concentration: Fair and Attention Span: Good  Recall:  Good  Fund of Knowledge: Good  Language: Good  Akathisia:  No  Handed:  Right  AIMS (if indicated):  None reported   Assets:  Communication Skills Desire for Improvement Housing Physical Health Social Support  ADL's:  Intact  Cognition: WNL  Sleep:  Good      Assessment; Montez MoritaCarter is 26 year old Caucasian man who has ADHD, autism , generalized anxiety disorder.  Plan ; I reviewed his records from the emergency room and current medication.  He started therapy for his impulsive behavior.  At this time there is no need to change his medication.  Patient feels good with the therapy.  Discussed risk and benefits of medication.  I explained to his other that if symptoms of relapse then he should call us immediately.  Continue Concerta 36 mg daily and Zoloft 50 mg daily.  Patient has no side effects including any tremors shakes or any insomnia.  Recommended to call us back if he has any question or any concern.  Discuss safety plan that anytime having active suicidal thoughts or homicidal and he need to call 911 or go to the local emergency room.  Follow-up in 3 months.   ARFEEN,SYED T., MD 12/22/2016, 2:35 PM

## 2016-12-24 ENCOUNTER — Other Ambulatory Visit (HOSPITAL_COMMUNITY): Payer: Self-pay | Admitting: Psychiatry

## 2016-12-24 DIAGNOSIS — F411 Generalized anxiety disorder: Secondary | ICD-10-CM

## 2016-12-28 DIAGNOSIS — F840 Autistic disorder: Secondary | ICD-10-CM | POA: Diagnosis not present

## 2017-01-11 DIAGNOSIS — F840 Autistic disorder: Secondary | ICD-10-CM | POA: Diagnosis not present

## 2017-01-18 DIAGNOSIS — F840 Autistic disorder: Secondary | ICD-10-CM | POA: Diagnosis not present

## 2017-01-19 DIAGNOSIS — E1065 Type 1 diabetes mellitus with hyperglycemia: Secondary | ICD-10-CM | POA: Diagnosis not present

## 2017-01-25 DIAGNOSIS — F840 Autistic disorder: Secondary | ICD-10-CM | POA: Diagnosis not present

## 2017-01-31 DIAGNOSIS — E1065 Type 1 diabetes mellitus with hyperglycemia: Secondary | ICD-10-CM | POA: Diagnosis not present

## 2017-02-01 DIAGNOSIS — F840 Autistic disorder: Secondary | ICD-10-CM | POA: Diagnosis not present

## 2017-02-08 DIAGNOSIS — F840 Autistic disorder: Secondary | ICD-10-CM | POA: Diagnosis not present

## 2017-02-15 DIAGNOSIS — F840 Autistic disorder: Secondary | ICD-10-CM | POA: Diagnosis not present

## 2017-02-28 ENCOUNTER — Telehealth (HOSPITAL_COMMUNITY): Payer: Self-pay

## 2017-02-28 NOTE — Telephone Encounter (Signed)
Patients appointment was rescheduled by our office and he needs a refill on Concerta 36 mg 1 po qd. Please review and advise, thank you

## 2017-03-01 ENCOUNTER — Other Ambulatory Visit (HOSPITAL_COMMUNITY): Payer: Self-pay | Admitting: Psychiatry

## 2017-03-01 ENCOUNTER — Telehealth (HOSPITAL_COMMUNITY): Payer: Self-pay | Admitting: Psychiatry

## 2017-03-01 DIAGNOSIS — F900 Attention-deficit hyperactivity disorder, predominantly inattentive type: Secondary | ICD-10-CM

## 2017-03-01 MED ORDER — METHYLPHENIDATE HCL ER (OSM) 36 MG PO TBCR: 36.0000 mg | EXTENDED_RELEASE_TABLET | ORAL | 0 refills | Status: DC

## 2017-03-01 NOTE — Telephone Encounter (Signed)
Printed and signed by Dr. Lolly MustacheArfeen, I called patient and let him know it was ready to pick up

## 2017-03-01 NOTE — Telephone Encounter (Signed)
Montez MoritaCarter picked up prescription on 1/61/093/13/18 lic [id number] dlo

## 2017-03-08 DIAGNOSIS — F840 Autistic disorder: Secondary | ICD-10-CM | POA: Diagnosis not present

## 2017-03-15 ENCOUNTER — Ambulatory Visit (HOSPITAL_COMMUNITY): Payer: Self-pay | Admitting: Psychiatry

## 2017-03-22 DIAGNOSIS — F840 Autistic disorder: Secondary | ICD-10-CM | POA: Diagnosis not present

## 2017-04-04 DIAGNOSIS — F840 Autistic disorder: Secondary | ICD-10-CM | POA: Diagnosis not present

## 2017-04-05 ENCOUNTER — Ambulatory Visit (INDEPENDENT_AMBULATORY_CARE_PROVIDER_SITE_OTHER): Payer: BLUE CROSS/BLUE SHIELD | Admitting: Psychiatry

## 2017-04-05 ENCOUNTER — Encounter (HOSPITAL_COMMUNITY): Payer: Self-pay | Admitting: Psychiatry

## 2017-04-05 DIAGNOSIS — F900 Attention-deficit hyperactivity disorder, predominantly inattentive type: Secondary | ICD-10-CM

## 2017-04-05 DIAGNOSIS — F840 Autistic disorder: Secondary | ICD-10-CM

## 2017-04-05 DIAGNOSIS — Z79899 Other long term (current) drug therapy: Secondary | ICD-10-CM

## 2017-04-05 DIAGNOSIS — F411 Generalized anxiety disorder: Secondary | ICD-10-CM | POA: Diagnosis not present

## 2017-04-05 MED ORDER — SERTRALINE HCL 50 MG PO TABS: 50.0000 mg | ORAL_TABLET | Freq: Every day | ORAL | 2 refills | Status: DC

## 2017-04-05 MED ORDER — METHYLPHENIDATE HCL ER (OSM) 36 MG PO TBCR: 36.0000 mg | EXTENDED_RELEASE_TABLET | ORAL | 0 refills | Status: DC

## 2017-04-05 NOTE — Progress Notes (Signed)
BH MD/PA/NP OP Progress Note  04/05/2017 3:55 PM Alexander Wilkerson  MRN:  [id number]  Chief Complaint:  Chief Complaint    Follow-up     Subjective:  I'm taking medication.  I'm seeing therapist.  I had no more incidents.  HPI: Alexander Wilkerson came for his follow-up appointment.  He is taking Zoloft and Concerta.  He denies any more incidence about exposing himself to his sister.  He is seeing therapist April Firseberry every other week which is helping his coping skills.  He is excited because sister is getting married in June.  Patient denies any irritability, anger, mania or any psychosis.  He sleeping good.  He really feels guilty about his behavior which he did at Thanksgiving.  He reports a good relationship with his sister .  Patient has ADHD and autism.  He denies drinking alcohol or using any illegal substances.  He continues to work as a Administrator .  His appetite is okay.  His vital signs are stable.  He is checking his blood sugar on a regular basis.  Patient denies drinking alcohol or using any illegal substances.    Visit Diagnosis:    ICD-9-CM ICD-10-CM   1. Generalized anxiety disorder 300.02 F41.1 sertraline (ZOLOFT) 50 MG tablet  2. Attention deficit hyperactivity disorder (ADHD), predominantly inattentive type 314.00 F90.0 methylphenidate 36 MG PO CR tablet     DISCONTINUED: methylphenidate 36 MG PO CR tablet     DISCONTINUED: methylphenidate 36 MG PO CR tablet    Past Psychiatric History: Reviewed.   Past Medical History:  Past Medical History:  Diagnosis Date  . Autism spectrum disorder   . Diabetes mellitus, type II [facility])     Past Surgical History:  Procedure Laterality Date  . WISDOM TOOTH EXTRACTION      Family Psychiatric History: Reviewed.   Family History: History reviewed. No pertinent family history.  Social History:  Social History   Social History  . Marital status: Single    Spouse name: N/A  . Number of children: N/A  . Years of education: N/A    Social History Main Topics  . Smoking status: Never Smoker  . Smokeless tobacco: Never Used  . Alcohol use No  . Drug use: No  . Sexual activity: Not Currently   Other Topics Concern  . None   Social History Narrative  . None    Allergies: No Known Allergies  Metabolic Disorder Labs: Lab Results  Component Value Date   HGBA1C 7.7 (H) 03/08/2015   MPG 174 (H) 03/08/2015   MPG 105 02/23/2014   No results found for: PROLACTIN Lab Results  Component Value Date   CHOL 138 02/23/2014   TRIG 56 02/23/2014   HDL 37 (L) 02/23/2014   CHOLHDL 3.7 02/23/2014   VLDL 11 02/23/2014   LDLCALC 90 02/23/2014     Current Medications: Current Outpatient Prescriptions  Medication Sig Dispense Refill  . methylphenidate 36 MG PO CR tablet Take 1 tablet (36 mg total) by mouth every morning. 30 tablet 0  . NOVOLOG MIX 70/30 FLEXPEN (70-30) 100 UNIT/ML FlexPen   0  . sertraline (ZOLOFT) 50 MG tablet Take 1 tablet (50 mg total) by mouth daily. 30 tablet 2   No current facility-administered medications for this visit.     Neurologic: Headache: No Seizure: No Paresthesias: No  Musculoskeletal: Strength & Muscle Tone: within normal limits Gait & Station: normal Patient leans: N/A  Psychiatric Specialty Exam: ROS  Blood pressure 118/74, pulse 78, height  5' 4.75" (1.645 m), weight 164 lb (74.4 kg), SpO2 98 %.Body mass index is 27.5 kg/m.  General Appearance: Casual  Eye Contact:  Fair  Speech:  Slow  Volume:  Normal  Mood:  Euthymic  Affect:  Congruent  Thought Process:  Goal Directed  Orientation:  Full (Time, Place, and Person)  Thought Content: Logical   Suicidal Thoughts:  No  Homicidal Thoughts:  No  Memory:  Immediate;   Fair Recent;   Fair Remote;   Fair  Judgement:  Good  Insight:  Good  Psychomotor Activity:  Normal  Concentration:  Concentration: Good and Attention Span: Good  Recall:  Good  Fund of Knowledge: Good  Language: Good  Akathisia:  No  Handed:   Right  AIMS (if indicated):  0  Assets:  Communication Skills Desire for Improvement Housing Physical Health Resilience  ADL's:  Intact  Cognition: WNL  Sleep:  ok    Assessment: Generalized anxiety disorder.  Autism.  ADHD   Plan: Patient is doing better since he started therapy for his impulsive behavior.  He is not engaged in any inappropriate behavior.  Like to continue his medication.  He has no side effects including any tremors, shakes, insomnia or any palpitation.  I will continue Zoloft 50 mg daily and Concerta 36 mg daily.  Discussed medication side effects and benefits.  Recommended to call us back if if he has any concerns, question or if he feel worsening of the symptoms.    Camron Essman T., MD 04/05/2017, 3:55 PM

## 2017-04-18 DIAGNOSIS — F840 Autistic disorder: Secondary | ICD-10-CM | POA: Diagnosis not present

## 2017-04-28 DIAGNOSIS — E1065 Type 1 diabetes mellitus with hyperglycemia: Secondary | ICD-10-CM | POA: Diagnosis not present

## 2017-05-02 DIAGNOSIS — F840 Autistic disorder: Secondary | ICD-10-CM | POA: Diagnosis not present

## 2017-05-30 DIAGNOSIS — F840 Autistic disorder: Secondary | ICD-10-CM | POA: Diagnosis not present

## 2017-06-13 DIAGNOSIS — F840 Autistic disorder: Secondary | ICD-10-CM | POA: Diagnosis not present

## 2017-07-05 ENCOUNTER — Ambulatory Visit (INDEPENDENT_AMBULATORY_CARE_PROVIDER_SITE_OTHER): Payer: BLUE CROSS/BLUE SHIELD | Admitting: Psychiatry

## 2017-07-05 ENCOUNTER — Encounter (HOSPITAL_COMMUNITY): Payer: Self-pay | Admitting: Psychiatry

## 2017-07-05 DIAGNOSIS — F840 Autistic disorder: Secondary | ICD-10-CM

## 2017-07-05 DIAGNOSIS — F411 Generalized anxiety disorder: Secondary | ICD-10-CM

## 2017-07-05 DIAGNOSIS — F900 Attention-deficit hyperactivity disorder, predominantly inattentive type: Secondary | ICD-10-CM

## 2017-07-05 DIAGNOSIS — F901 Attention-deficit hyperactivity disorder, predominantly hyperactive type: Secondary | ICD-10-CM | POA: Diagnosis not present

## 2017-07-05 MED ORDER — METHYLPHENIDATE HCL ER (OSM) 36 MG PO TBCR: 36.0000 mg | EXTENDED_RELEASE_TABLET | ORAL | 0 refills | Status: DC

## 2017-07-05 MED ORDER — SERTRALINE HCL 50 MG PO TABS: 50.0000 mg | ORAL_TABLET | Freq: Every day | ORAL | 2 refills | Status: DC

## 2017-07-05 NOTE — Progress Notes (Signed)
BH MD/PA/NP OP Progress Note  07/05/2017 4:14 PM Alexander Wilkerson  MRN:  [id number]  Chief Complaint:  Subjective:  I am doing good.  HPI: Patient came for her follow-up appointment.  He is taking his medication as prescribed.  He denies any irritability, anger, mania or any psychosis.  His attention, cognition is good.  He is able to do multitasking.  He really likes his job and he has no issue there.  He denies any impulsive behavior in recent months.  He reported a good relationship with his sister.  He continues to work as a Administrator.  Patient denies drinking alcohol or using any illegal substances.  He is pleased that his blood sugar is under control.  He is checking his blood sugar on a regular basis.  His appetite is okay.  His energy level is good.  Visit Diagnosis:    ICD-10-CM   1. Generalized anxiety disorder F41.1 sertraline (ZOLOFT) 50 MG tablet  2. Attention deficit hyperactivity disorder (ADHD), predominantly inattentive type F90.0 methylphenidate 36 MG PO CR tablet    DISCONTINUED: methylphenidate 36 MG PO CR tablet    DISCONTINUED: methylphenidate 36 MG PO CR tablet    Past Psychiatric History: Reviewed. Patient has ADHD.  In the past he had tried Adderall.  He is taking Concerta and Zoloft since 2006.  Past Medical History:  Past Medical History:  Diagnosis Date  . Autism spectrum disorder   . Diabetes mellitus, type II [facility]     Past Surgical History:  Procedure Laterality Date  . WISDOM TOOTH EXTRACTION      Family Psychiatric History: Reviewed.  Family History: No family history on file.  Social History:  Social History   Social History  . Marital status: Single    Spouse name: N/A  . Number of children: N/A  . Years of education: N/A   Social History Main Topics  . Smoking status: Never Smoker  . Smokeless tobacco: Never Used  . Alcohol use No  . Drug use: No  . Sexual activity: Not Currently   Other Topics Concern  . None   Social History  Narrative  . None    Allergies: No Known Allergies  Metabolic Disorder Labs: Lab Results  Component Value Date   HGBA1C 7.7 (H) 03/08/2015   MPG 174 (H) 03/08/2015   MPG 105 02/23/2014   No results found for: PROLACTIN Lab Results  Component Value Date   CHOL 138 02/23/2014   TRIG 56 02/23/2014   HDL 37 (L) 02/23/2014   CHOLHDL 3.7 02/23/2014   VLDL 11 02/23/2014   LDLCALC 90 02/23/2014     Current Medications: Current Outpatient Prescriptions  Medication Sig Dispense Refill  . methylphenidate 36 MG PO CR tablet Take 1 tablet (36 mg total) by mouth every morning. 30 tablet 0  . NOVOLOG MIX 70/30 FLEXPEN (70-30) 100 UNIT/ML FlexPen   0  . sertraline (ZOLOFT) 50 MG tablet Take 1 tablet (50 mg total) by mouth daily. 30 tablet 2   No current facility-administered medications for this visit.     Neurologic: Headache: No Seizure: No Paresthesias: No  Musculoskeletal: Strength & Muscle Tone: within normal limits Gait & Station: normal Patient leans: N/A  Psychiatric Specialty Exam: ROS  Blood pressure 117/76, pulse 84, resp. rate 12, height 5\' 4"  (1.626 m), weight 161 lb 3.2 oz (73.1 kg).Body mass index is 27.67 kg/m.  General Appearance: Casual  Eye Contact:  Good  Speech:  Slow  Volume:  Normal  Mood:  Euthymic  Affect:  Congruent  Thought Process:  Goal Directed  Orientation:  Full (Time, Place, and Person)  Thought Content: Logical   Suicidal Thoughts:  No  Homicidal Thoughts:  No  Memory:  Immediate;   Good Recent;   Good Remote;   Good  Judgement:  Good  Insight:  Good  Psychomotor Activity:  Normal  Concentration:  Concentration: Good and Attention Span: Good  Recall:  Good  Fund of Knowledge: Good  Language: Good  Akathisia:  No  Handed:  Right  AIMS (if indicated):  0  Assets:  Communication Skills Desire for Improvement Housing Resilience Social Support Talents/Skills  ADL's:  Intact  Cognition: WNL  Sleep:  ok     Assessment: Generalized anxiety disorder.  Attention deficit disorder hyperactivity type.  Autism.  Plan: Patient is a stable on his current psychiatric medication.  I will continue Zoloft 50 mg daily and Concerta 36 mg daily.  Patient has no tremors, shakes or any side effects.  Recommended to call us back if he has any question or any concern.  Follow-up in 3 months.  Aggie Douse T., MD 07/05/2017, 4:14 PM

## 2017-07-18 DIAGNOSIS — F840 Autistic disorder: Secondary | ICD-10-CM | POA: Diagnosis not present

## 2017-08-22 DIAGNOSIS — F840 Autistic disorder: Secondary | ICD-10-CM | POA: Diagnosis not present

## 2017-10-03 ENCOUNTER — Other Ambulatory Visit (HOSPITAL_COMMUNITY): Payer: Self-pay

## 2017-10-03 DIAGNOSIS — F411 Generalized anxiety disorder: Secondary | ICD-10-CM

## 2017-10-03 MED ORDER — SERTRALINE HCL 50 MG PO TABS: 50.0000 mg | ORAL_TABLET | Freq: Every day | ORAL | 0 refills | Status: DC

## 2017-10-04 ENCOUNTER — Encounter (HOSPITAL_COMMUNITY): Payer: Self-pay | Admitting: Psychiatry

## 2017-10-04 ENCOUNTER — Ambulatory Visit (INDEPENDENT_AMBULATORY_CARE_PROVIDER_SITE_OTHER): Payer: BLUE CROSS/BLUE SHIELD | Admitting: Psychiatry

## 2017-10-04 DIAGNOSIS — F411 Generalized anxiety disorder: Secondary | ICD-10-CM | POA: Diagnosis not present

## 2017-10-04 DIAGNOSIS — F900 Attention-deficit hyperactivity disorder, predominantly inattentive type: Secondary | ICD-10-CM | POA: Diagnosis not present

## 2017-10-04 DIAGNOSIS — Z810 Family history of intellectual disabilities: Secondary | ICD-10-CM

## 2017-10-04 MED ORDER — METHYLPHENIDATE HCL ER (OSM) 36 MG PO TBCR: 36.0000 mg | EXTENDED_RELEASE_TABLET | ORAL | 0 refills | Status: DC

## 2017-10-04 MED ORDER — SERTRALINE HCL 50 MG PO TABS: 50.0000 mg | ORAL_TABLET | Freq: Every day | ORAL | 2 refills | Status: DC

## 2017-10-04 NOTE — Progress Notes (Signed)
BH MD/PA/NP OP Progress Note  10/04/2017 4:21 PM Alexander Wilkerson  MRN:  [id number]  Chief Complaint:  I'm taking her medication.  I'm doing good.  HPI: Patient came for his follow-up appointment.  He is compliant with his medication and denies any side effects.  He is happy his job is going very well.  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  His been working for past 3 years.  He denies any irritability, anger, mania or any psychosis.  His attention and consultation is good.  He is able to do multitasking.  He denies any panic attack or any impulsive behavior.  He denies drinking alcohol or using any illegal substances.  He has no tremors shakes or any EPS.  His appetite is okay.  He is scheduled to see Dr. Romero Belling on 29 for his blood sugar.  He said he is blood sugar on a regular basis.  His energy level is good.  He lives by himself but he has a good relationship with his sister.  Patient has no children.  Visit Diagnosis:    ICD-10-CM   1. Generalized anxiety disorder F41.1 sertraline (ZOLOFT) 50 MG tablet  2. Attention deficit hyperactivity disorder (ADHD), predominantly inattentive type F90.0 methylphenidate 36 MG PO CR tablet    DISCONTINUED: methylphenidate 36 MG PO CR tablet    DISCONTINUED: methylphenidate 36 MG PO CR tablet    Past Psychiatric History: Reviewed. Patient has ADHD. In the past he had tried Adderall. He is taking Concerta and Zoloft since 2006.  Past Medical History:  Past Medical History:  Diagnosis Date  . Autism spectrum disorder   . Diabetes mellitus, type II [facility])     Past Surgical History:  Procedure Laterality Date  . WISDOM TOOTH EXTRACTION      Family Psychiatric History: Reviewed.  Family History: No family history on file.  Social History:  Social History   Social History  . Marital status: Single    Spouse name: N/A  . Number of children: N/A  . Years of education: N/A   Social History Main Topics  . Smoking status: Never  Smoker  . Smokeless tobacco: Never Used  . Alcohol use No  . Drug use: No  . Sexual activity: Not Currently   Other Topics Concern  . Not on file   Social History Narrative  . No narrative on file    Allergies: No Known Allergies  Metabolic Disorder Labs: Lab Results  Component Value Date   HGBA1C 7.7 (H) 03/08/2015   MPG 174 (H) 03/08/2015   MPG 105 02/23/2014   No results found for: PROLACTIN Lab Results  Component Value Date   CHOL 138 02/23/2014   TRIG 56 02/23/2014   HDL 37 (L) 02/23/2014   CHOLHDL 3.7 02/23/2014   VLDL 11 02/23/2014   LDLCALC 90 02/23/2014   Lab Results  Component Value Date   TSH 1.614 03/08/2015   TSH 2.641 02/23/2014    Therapeutic Level Labs: No results found for: LITHIUM No results found for: VALPROATE No components found for:  CBMZ  Current Medications: Current Outpatient Prescriptions  Medication Sig Dispense Refill  . methylphenidate 36 MG PO CR tablet Take 1 tablet (36 mg total) by mouth every morning. 30 tablet 0  . NOVOLOG MIX 70/30 FLEXPEN (70-30) 100 UNIT/ML FlexPen   0  . sertraline (ZOLOFT) 50 MG tablet Take 1 tablet (50 mg total) by mouth daily. 30 tablet 0   No current facility-administered medications for  this visit.      Musculoskeletal: Strength & Muscle Tone: within normal limits Gait & Station: normal Patient leans: N/A  Psychiatric Specialty Exam: ROS  Blood pressure 118/68, pulse 88, height  (1.651 m), weight 158 lb 12.8 oz (72 kg).There is no height or weight on file to calculate BMI.  General Appearance: Casual  Eye Contact:  Good  Speech:  Slow  Volume:  Normal  Mood:  Anxious  Affect:  Appropriate  Thought Process:  Goal Directed  Orientation:  Full (Time, Place, and Person)  Thought Content: Logical   Suicidal Thoughts:  No  Homicidal Thoughts:  No  Memory:  Immediate;   Good Recent;   Good Remote;   Good  Judgement:  Good  Insight:  Good  Psychomotor Activity:  Normal   Concentration:  Concentration: Good and Attention Span: Good  Recall:  Good  Fund of Knowledge: Good  Language: Good  Akathisia:  No  Handed:  Right  AIMS (if indicated): not done  Assets:  Communication Skills Desire for Improvement Housing Resilience Social Support Talents/Skills  ADL's:  Intact  Cognition: WNL  Sleep:  Good   Screenings: PHQ2-9     Nutrition from 01/22/2016 in [facility]  PHQ-2 Total Score  0       Assessment and Plan: Attention deficit disorder, inattentive type.  Generalized anxiety disorder.  Patient is a stable on his current psychiatric medication.  He has no side effects.  He has no tremors shakes or any EPS.  I will continue Zoloft 50 mg daily and Concerta 36 mg daily.  Recommended to call us back if he has any question or any concern.  Follow-up in 3 months.   ARFEEN,SYED T., MD 10/04/2017, 4:21 PM

## 2017-10-17 DIAGNOSIS — E1065 Type 1 diabetes mellitus with hyperglycemia: Secondary | ICD-10-CM | POA: Diagnosis not present

## 2017-10-17 DIAGNOSIS — F840 Autistic disorder: Secondary | ICD-10-CM | POA: Diagnosis not present

## 2017-10-24 DIAGNOSIS — E1065 Type 1 diabetes mellitus with hyperglycemia: Secondary | ICD-10-CM | POA: Diagnosis not present

## 2017-10-24 DIAGNOSIS — E02 Subclinical iodine-deficiency hypothyroidism: Secondary | ICD-10-CM | POA: Diagnosis not present

## 2017-11-14 DIAGNOSIS — F840 Autistic disorder: Secondary | ICD-10-CM | POA: Diagnosis not present

## 2017-12-15 DIAGNOSIS — F840 Autistic disorder: Secondary | ICD-10-CM | POA: Diagnosis not present

## 2017-12-23 DIAGNOSIS — E02 Subclinical iodine-deficiency hypothyroidism: Secondary | ICD-10-CM | POA: Diagnosis not present

## 2018-01-03 ENCOUNTER — Ambulatory Visit (HOSPITAL_COMMUNITY): Payer: BLUE CROSS/BLUE SHIELD | Admitting: Psychiatry

## 2018-01-03 ENCOUNTER — Encounter (HOSPITAL_COMMUNITY): Payer: Self-pay | Admitting: Psychiatry

## 2018-01-03 DIAGNOSIS — Z79899 Other long term (current) drug therapy: Secondary | ICD-10-CM

## 2018-01-03 DIAGNOSIS — F411 Generalized anxiety disorder: Secondary | ICD-10-CM

## 2018-01-03 DIAGNOSIS — F900 Attention-deficit hyperactivity disorder, predominantly inattentive type: Secondary | ICD-10-CM

## 2018-01-03 MED ORDER — METHYLPHENIDATE HCL ER (OSM) 36 MG PO TBCR: 36.0000 mg | EXTENDED_RELEASE_TABLET | ORAL | 0 refills | Status: DC

## 2018-01-03 MED ORDER — SERTRALINE HCL 50 MG PO TABS: 50.0000 mg | ORAL_TABLET | Freq: Every day | ORAL | 2 refills | Status: DC

## 2018-01-03 NOTE — Progress Notes (Signed)
BH MD/PA/NP OP Progress Note  01/03/2018 4:07 PM [name]  MRN:  [id number]  Chief Complaint: I had a good Christmas.  I am taking the medication.  HPI: Alexander Wilkerson came for his follow-up appointment.  He had a good Christmas.  He is compliant with his medication.  He has no side effects.  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  He works at [employer] and [employer] with [employer].  He likes his job.  He denies any irritability, anger, mania or any psychosis.  He is taking Concerta which is helping his attention and focus.  He denies any panic attacks.  He denies any crying spells or any feeling of hopelessness or worthlessness.  He saw his endocrinologist Dr. Romero Belling and he was told his hemoglobin A1c is 7 which was unchanged from his past.  He is trying to keep his diet under control.  His energy level is good.  He lives by himself however he had a very good relationship with his sister who lives close by.  Patient currently not in any relationship.  He has no children.  He denies drinking alcohol or using any illegal substances.  Visit Diagnosis:    ICD-10-CM   1. Attention deficit hyperactivity disorder (ADHD), predominantly inattentive type F90.0 methylphenidate 36 MG PO CR tablet    DISCONTINUED: methylphenidate 36 MG PO CR tablet    DISCONTINUED: methylphenidate 36 MG PO CR tablet  2. Generalized anxiety disorder F41.1 sertraline (ZOLOFT) 50 MG tablet    Past Psychiatric History: Reviewed. Patient has ADHD. In the past he had tried Adderall. He is taking Concerta and Zoloft since 2006.  Past Medical History:  Past Medical History:  Diagnosis Date  . Autism spectrum disorder   . Diabetes mellitus, type II [facility])     Past Surgical History:  Procedure Laterality Date  . WISDOM TOOTH EXTRACTION      Family Psychiatric History: Viewed.  Family History: No family history on file.  Social History:  Social History    Socioeconomic History  . Marital status: Single    Spouse name: Not on file  . Number of children: Not on file  . Years of education: Not on file  . Highest education level: Not on file  Social Needs  . Financial resource strain: Not on file  . Food insecurity - worry: Not on file  . Food insecurity - inability: Not on file  . Transportation needs - medical: Not on file  . Transportation needs - non-medical: Not on file  Occupational History  . Not on file  Tobacco Use  . Smoking status: Never Smoker  . Smokeless tobacco: Never Used  Substance and Sexual Activity  . Alcohol use: No    Alcohol/week: 0.0 oz  . Drug use: No  . Sexual activity: Not Currently  Other Topics Concern  . Not on file  Social History Narrative  . Not on file    Allergies: No Known Allergies  Metabolic Disorder Labs: Lab Results  Component Value Date   HGBA1C 7.7 (H) 03/08/2015   MPG 174 (H) 03/08/2015   MPG 105 02/23/2014   No results found for: PROLACTIN Lab Results  Component Value Date   CHOL 138 02/23/2014   TRIG 56 02/23/2014   HDL 37 (L) 02/23/2014   CHOLHDL 3.7 02/23/2014   VLDL 11 02/23/2014   LDLCALC 90 02/23/2014   Lab Results  Component Value Date  TSH 1.614 03/08/2015   TSH 2.641 02/23/2014    Therapeutic Level Labs: No results found for: LITHIUM No results found for: VALPROATE No components found for:  CBMZ  Current Medications: Current Outpatient Medications  Medication Sig Dispense Refill  . methylphenidate 36 MG PO CR tablet Take 1 tablet (36 mg total) by mouth every morning. 30 tablet 0  . NOVOLOG MIX 70/30 FLEXPEN (70-30) 100 UNIT/ML FlexPen   0  . sertraline (ZOLOFT) 50 MG tablet Take 1 tablet (50 mg total) by mouth daily. 30 tablet 2   No current facility-administered medications for this visit.      Musculoskeletal: Strength & Muscle Tone: within normal limits Gait & Station: normal Patient leans: N/A  Psychiatric Specialty Exam: ROS  Blood  pressure 132/76, pulse 91, height 5' 4.5" (1.638 m), weight 162 lb (73.5 kg).Body mass index is 27.38 kg/m.  General Appearance: Casual  Eye Contact:  Fair  Speech:  Clear and Coherent  Volume:  Normal  Mood:  Euthymic  Affect:  Congruent  Thought Process:  Goal Directed  Orientation:  Full (Time, Place, and Person)  Thought Content: Logical   Suicidal Thoughts:  No  Homicidal Thoughts:  No  Memory:  Immediate;   Good Recent;   Good Remote;   Good  Judgement:  Good  Insight:  Good  Psychomotor Activity:  Normal  Concentration:  Concentration: Fair and Attention Span: Fair  Recall:  Good  Fund of Knowledge: Good  Language: Good  Akathisia:  No  Handed:  Right  AIMS (if indicated): not done  Assets:  Communication Skills Desire for Improvement Housing Resilience Social Support Talents/Skills Transportation  ADL's:  Intact  Cognition: WNL  Sleep:  Good   Screenings: PHQ2-9     Nutrition from 01/22/2016 in [facility]  PHQ-2 Total Score  0       Assessment and Plan: Attention deficit disorder, inattentive type.  Generalized anxiety disorder.  Patient doing well on his current psychiatric medication.  We will get records from his endocrinologist since he had recent visit.  Continue Zoloft 50 mg daily and Concerta 36 mg daily.  Recommended to call us back if is any question, concern for feel worsening of the symptoms.  Patient has no tremors shakes or any EPS.  Follow-up in 3 months.   Cleotis NipperSyed T Bern Fare, MD 01/03/2018, 4:07 PM

## 2018-01-10 DIAGNOSIS — F840 Autistic disorder: Secondary | ICD-10-CM | POA: Diagnosis not present

## 2018-02-14 DIAGNOSIS — F840 Autistic disorder: Secondary | ICD-10-CM | POA: Diagnosis not present

## 2018-03-20 DIAGNOSIS — F840 Autistic disorder: Secondary | ICD-10-CM | POA: Diagnosis not present

## 2018-04-04 ENCOUNTER — Ambulatory Visit (INDEPENDENT_AMBULATORY_CARE_PROVIDER_SITE_OTHER): Payer: BLUE CROSS/BLUE SHIELD | Admitting: Psychiatry

## 2018-04-04 ENCOUNTER — Encounter (HOSPITAL_COMMUNITY): Payer: Self-pay | Admitting: Psychiatry

## 2018-04-04 DIAGNOSIS — F411 Generalized anxiety disorder: Secondary | ICD-10-CM

## 2018-04-04 DIAGNOSIS — F900 Attention-deficit hyperactivity disorder, predominantly inattentive type: Secondary | ICD-10-CM | POA: Diagnosis not present

## 2018-04-04 MED ORDER — SERTRALINE HCL 50 MG PO TABS: 50.0000 mg | ORAL_TABLET | Freq: Every day | ORAL | 2 refills | Status: DC

## 2018-04-04 MED ORDER — METHYLPHENIDATE HCL ER (OSM) 36 MG PO TBCR: 36.0000 mg | EXTENDED_RELEASE_TABLET | ORAL | 0 refills | Status: DC

## 2018-04-04 NOTE — Progress Notes (Signed)
BH MD/PA/NP OP Progress Note  04/04/2018 4:00 PM Alexander Wilkerson  MRN:  [id number]  Chief Complaint: I am doing better.  My medicines working.  HPI: Alexander Wilkerson came for his follow-up appointment.  He is taking his medication and denies any side effects.  He is back to his job since March and he really enjoys his work.  He is working 40 hours a week.  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  His attention and focus is good.  He is able to do multitasking.  He denies any anxiety or any nervousness.  He is sleeping good.  He admitted sometimes still struggling to control his blood sugar but he has done better than before.  He lives by himself however he has a very good relationship with his sister who lives close by.  Patient denies any paranoia, hallucination, irritability or any anger.  He denies drinking alcohol or using any illegal substances.  Visit Diagnosis:    ICD-10-CM   1. Generalized anxiety disorder F41.1 sertraline (ZOLOFT) 50 MG tablet  2. Attention deficit hyperactivity disorder (ADHD), predominantly inattentive type F90.0 methylphenidate 36 MG PO CR tablet    DISCONTINUED: methylphenidate 36 MG PO CR tablet    Past Psychiatric History: Reviewed. Patient has diagnosis of ADHD.  In the past year tried Adderall.  He is taking Concerta and Zoloft since 2006.  Past Medical History:  Past Medical History:  Diagnosis Date  . Autism spectrum disorder   . Diabetes mellitus, type II [facility])     Past Surgical History:  Procedure Laterality Date  . WISDOM TOOTH EXTRACTION      Family Psychiatric History: Reviewed  Family History: No family history on file.  Social History:  Social History   Socioeconomic History  . Marital status: Single    Spouse name: Not on file  . Number of children: Not on file  . Years of education: Not on file  . Highest education level: Not on file  Occupational History  . Not on file  Social Needs  . Financial  resource strain: Not on file  . Food insecurity:    Worry: Not on file    Inability: Not on file  . Transportation needs:    Medical: Not on file    Non-medical: Not on file  Tobacco Use  . Smoking status: Never Smoker  . Smokeless tobacco: Never Used  Substance and Sexual Activity  . Alcohol use: No    Alcohol/week: 0.0 oz  . Drug use: No  . Sexual activity: Not Currently  Lifestyle  . Physical activity:    Days per week: Not on file    Minutes per session: Not on file  . Stress: Not on file  Relationships  . Social connections:    Talks on phone: Not on file    Gets together: Not on file    Attends religious service: Not on file    Active member of club or organization: Not on file    Attends meetings of clubs or organizations: Not on file    Relationship status: Not on file  Other Topics Concern  . Not on file  Social History Narrative  . Not on file    Allergies: No Known Allergies  Metabolic Disorder Labs: Lab Results  Component Value Date   HGBA1C 7.7 (H) 03/08/2015   MPG 174 (H) 03/08/2015   MPG 105 02/23/2014   No results found for: PROLACTIN Lab Results  Component Value Date   CHOL 138 02/23/2014   TRIG 56 02/23/2014   HDL 37 (L) 02/23/2014   CHOLHDL 3.7 02/23/2014   VLDL 11 02/23/2014   LDLCALC 90 02/23/2014   Lab Results  Component Value Date   TSH 1.614 03/08/2015   TSH 2.641 02/23/2014    Therapeutic Level Labs: No results found for: LITHIUM No results found for: VALPROATE No components found for:  CBMZ  Current Medications: Current Outpatient Medications  Medication Sig Dispense Refill  . methylphenidate 36 MG PO CR tablet Take 1 tablet (36 mg total) by mouth every morning. 30 tablet 0  . NOVOLOG MIX 70/30 FLEXPEN (70-30) 100 UNIT/ML FlexPen   0  . sertraline (ZOLOFT) 50 MG tablet Take 1 tablet (50 mg total) by mouth daily. 30 tablet 2   No current facility-administered medications for this visit.      Musculoskeletal: Strength  & Muscle Tone: within normal limits Gait & Station: normal Patient leans: N/A  Psychiatric Specialty Exam: ROS  Blood pressure 112/74, pulse 80, height 5' 4.5" (1.638 m), weight 164 lb (74.4 kg).Body mass index is 27.72 kg/m.  General Appearance: Fairly Groomed  Eye Contact:  Fair  Speech:  Slow  Volume:  Normal  Mood:  Euthymic  Affect:  Congruent  Thought Process:  Goal Directed  Orientation:  Full (Time, Place, and Person)  Thought Content: Logical   Suicidal Thoughts:  No  Homicidal Thoughts:  No  Memory:  Immediate;   Good Recent;   Good Remote;   Good  Judgement:  Good  Insight:  Good  Psychomotor Activity:  Normal  Concentration:  Concentration: Fair and Attention Span: Fair  Recall:  Good  Fund of Knowledge: Good  Language: Good  Akathisia:  No  Handed:  Right  AIMS (if indicated): not done  Assets:  Communication Skills Desire for Improvement Housing Resilience Social Support  ADL's:  Intact  Cognition: WNL  Sleep:  Good   Screenings: PHQ2-9     Nutrition from 01/22/2016 in [facility]  PHQ-2 Total Score  0       Assessment and Plan: Generalized anxiety disorder.  Attention deficit disorder, inattentive type.  Patient doing well on his current medication.  He has no side effects.  Continue Zoloft 50 mg daily and Concerta 36 mg daily.  Recommended to call us back if is any question or any concern.  Follow-up in 3 months.   Cleotis NipperSyed T Ixchel Duck, MD 04/04/2018, 4:00 PM

## 2018-04-17 DIAGNOSIS — F840 Autistic disorder: Secondary | ICD-10-CM | POA: Diagnosis not present

## 2018-05-02 DIAGNOSIS — E1065 Type 1 diabetes mellitus with hyperglycemia: Secondary | ICD-10-CM | POA: Diagnosis not present

## 2018-05-02 DIAGNOSIS — E02 Subclinical iodine-deficiency hypothyroidism: Secondary | ICD-10-CM | POA: Diagnosis not present

## 2018-06-08 ENCOUNTER — Other Ambulatory Visit (HOSPITAL_COMMUNITY): Payer: Self-pay | Admitting: Psychiatry

## 2018-06-08 ENCOUNTER — Telehealth (HOSPITAL_COMMUNITY): Payer: Self-pay

## 2018-06-08 DIAGNOSIS — F900 Attention-deficit hyperactivity disorder, predominantly inattentive type: Secondary | ICD-10-CM

## 2018-06-08 MED ORDER — METHYLPHENIDATE HCL ER (OSM) 36 MG PO TBCR: 36.0000 mg | EXTENDED_RELEASE_TABLET | ORAL | 0 refills | Status: DC

## 2018-06-08 NOTE — Telephone Encounter (Signed)
Patient has a follow up in July and is calling for a refill on Concerta. Please review and advise, thank you

## 2018-06-19 DIAGNOSIS — F840 Autistic disorder: Secondary | ICD-10-CM | POA: Diagnosis not present

## 2018-07-04 ENCOUNTER — Ambulatory Visit (HOSPITAL_COMMUNITY): Payer: BLUE CROSS/BLUE SHIELD | Admitting: Psychiatry

## 2018-07-12 ENCOUNTER — Telehealth (HOSPITAL_COMMUNITY): Payer: Self-pay

## 2018-07-12 DIAGNOSIS — F900 Attention-deficit hyperactivity disorder, predominantly inattentive type: Secondary | ICD-10-CM

## 2018-07-12 NOTE — Telephone Encounter (Signed)
This is an Arfeen patient that was rescheduled, he needs a prescription of Concerta 36 mg and uses [facility] on [address].

## 2018-07-13 MED ORDER — METHYLPHENIDATE HCL ER (OSM) 36 MG PO TBCR: 36.0000 mg | EXTENDED_RELEASE_TABLET | ORAL | 0 refills | Status: DC

## 2018-07-13 NOTE — Telephone Encounter (Signed)
All set, sent it now 

## 2018-07-14 DIAGNOSIS — F840 Autistic disorder: Secondary | ICD-10-CM | POA: Diagnosis not present

## 2018-07-25 ENCOUNTER — Other Ambulatory Visit (HOSPITAL_COMMUNITY): Payer: Self-pay | Admitting: Psychiatry

## 2018-07-25 DIAGNOSIS — F411 Generalized anxiety disorder: Secondary | ICD-10-CM

## 2018-08-04 ENCOUNTER — Other Ambulatory Visit (HOSPITAL_COMMUNITY): Payer: Self-pay

## 2018-08-04 DIAGNOSIS — F411 Generalized anxiety disorder: Secondary | ICD-10-CM

## 2018-08-04 MED ORDER — SERTRALINE HCL 50 MG PO TABS: 50.0000 mg | ORAL_TABLET | Freq: Every day | ORAL | 2 refills | Status: DC

## 2018-08-07 DIAGNOSIS — F840 Autistic disorder: Secondary | ICD-10-CM | POA: Diagnosis not present

## 2018-08-11 ENCOUNTER — Telehealth (HOSPITAL_COMMUNITY): Payer: Self-pay

## 2018-08-11 DIAGNOSIS — F900 Attention-deficit hyperactivity disorder, predominantly inattentive type: Secondary | ICD-10-CM

## 2018-08-11 NOTE — Telephone Encounter (Signed)
Patient is calling for a [facility] on [facility] [address]

## 2018-08-15 MED ORDER — METHYLPHENIDATE HCL ER (OSM) 36 MG PO TBCR: 36.0000 mg | EXTENDED_RELEASE_TABLET | ORAL | 0 refills | Status: DC

## 2018-08-15 NOTE — Telephone Encounter (Signed)
Refill sent.

## 2018-08-22 DIAGNOSIS — E1065 Type 1 diabetes mellitus with hyperglycemia: Secondary | ICD-10-CM | POA: Diagnosis not present

## 2018-08-22 DIAGNOSIS — E02 Subclinical iodine-deficiency hypothyroidism: Secondary | ICD-10-CM | POA: Diagnosis not present

## 2018-08-29 ENCOUNTER — Ambulatory Visit (HOSPITAL_COMMUNITY): Payer: BLUE CROSS/BLUE SHIELD | Admitting: Psychiatry

## 2018-08-29 DIAGNOSIS — E02 Subclinical iodine-deficiency hypothyroidism: Secondary | ICD-10-CM | POA: Diagnosis not present

## 2018-08-29 DIAGNOSIS — E1065 Type 1 diabetes mellitus with hyperglycemia: Secondary | ICD-10-CM | POA: Diagnosis not present

## 2018-09-04 DIAGNOSIS — F840 Autistic disorder: Secondary | ICD-10-CM | POA: Diagnosis not present

## 2018-09-13 ENCOUNTER — Other Ambulatory Visit (HOSPITAL_COMMUNITY): Payer: Self-pay

## 2018-09-13 ENCOUNTER — Other Ambulatory Visit (HOSPITAL_COMMUNITY): Payer: Self-pay | Admitting: Psychiatry

## 2018-09-13 ENCOUNTER — Telehealth (HOSPITAL_COMMUNITY): Payer: Self-pay | Admitting: *Deleted

## 2018-09-13 DIAGNOSIS — F900 Attention-deficit hyperactivity disorder, predominantly inattentive type: Secondary | ICD-10-CM

## 2018-09-13 MED ORDER — METHYLPHENIDATE HCL ER (OSM) 36 MG PO TBCR: 36.0000 mg | EXTENDED_RELEASE_TABLET | ORAL | 0 refills | Status: DC

## 2018-10-12 ENCOUNTER — Other Ambulatory Visit (HOSPITAL_COMMUNITY): Payer: Self-pay | Admitting: Psychiatry

## 2018-10-12 ENCOUNTER — Telehealth (HOSPITAL_COMMUNITY): Payer: Self-pay

## 2018-10-12 DIAGNOSIS — F900 Attention-deficit hyperactivity disorder, predominantly inattentive type: Secondary | ICD-10-CM

## 2018-10-12 MED ORDER — METHYLPHENIDATE HCL ER (OSM) 36 MG PO TBCR: 36.0000 mg | EXTENDED_RELEASE_TABLET | ORAL | 0 refills | Status: DC

## 2018-10-12 NOTE — Telephone Encounter (Signed)
Patient has an appointment next month and needs a refill on his Methylphenidate, he uses [facility] on [address]

## 2018-10-12 NOTE — Telephone Encounter (Signed)
Refill done.  Call the patient to pick up the medication and keep his appointment for future refills.

## 2018-10-23 DIAGNOSIS — F840 Autistic disorder: Secondary | ICD-10-CM | POA: Diagnosis not present

## 2018-10-26 ENCOUNTER — Ambulatory Visit (INDEPENDENT_AMBULATORY_CARE_PROVIDER_SITE_OTHER): Payer: BLUE CROSS/BLUE SHIELD | Admitting: Psychiatry

## 2018-10-26 ENCOUNTER — Encounter (HOSPITAL_COMMUNITY): Payer: Self-pay | Admitting: Psychiatry

## 2018-10-26 DIAGNOSIS — F411 Generalized anxiety disorder: Secondary | ICD-10-CM | POA: Diagnosis not present

## 2018-10-26 DIAGNOSIS — F900 Attention-deficit hyperactivity disorder, predominantly inattentive type: Secondary | ICD-10-CM

## 2018-10-26 MED ORDER — METHYLPHENIDATE HCL ER (OSM) 36 MG PO TBCR: 36.0000 mg | EXTENDED_RELEASE_TABLET | ORAL | 0 refills | Status: DC

## 2018-10-26 MED ORDER — SERTRALINE HCL 50 MG PO TABS: 50.0000 mg | ORAL_TABLET | Freq: Every day | ORAL | 2 refills | Status: DC

## 2018-10-26 NOTE — Progress Notes (Signed)
BH MD/PA/NP OP Progress Note  10/26/2018 4:13 PM Alexander Wilkerson  MRN:  [id number]  Chief Complaint: I am doing good.  My medicine is working.  HPI: Alexander Wilkerson came for his follow-up appointment.  He is compliant with medication denies any side effects.  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  Patient feels medicine working.  His attention, focus and multitasking is good.  He denies any anxiety and nervousness.  He taking Zoloft which is helping his anxiety and sleep.  He is seeing Dr. Cleon Wilkerson for his diabetes.  He admitted few months ago he was not able to control his diabetes and his hemoglobin A1c jumped to 8.5 but last reading was 7.5.  He had a good relationship with his parents who live in [place] for past few years.  His sister lives in [place] and he sometime visited them.  Patient denies any paranoia, hallucination or any suicidal thoughts.  He denies drinking or using any illegal substances.  He does not ask for early refills from stimulants.  His energy level is good.  Visit Diagnosis:    ICD-10-CM   1. Attention deficit hyperactivity disorder (ADHD), predominantly inattentive type F90.0 methylphenidate 36 MG PO CR tablet  2. Generalized anxiety disorder F41.1 sertraline (ZOLOFT) 50 MG tablet    Past Psychiatric History: Reviewed. Patient has diagnosis of ADHD.  In the past year tried Adderall.  He is taking Concerta and Zoloft since 2006.  Past Medical History:  Past Medical History:  Diagnosis Date  . Autism spectrum disorder   . Diabetes mellitus, type II [facility])     Past Surgical History:  Procedure Laterality Date  . WISDOM TOOTH EXTRACTION      Family Psychiatric History: Reviewed.  Family History: No family history on file.  Social History:  Social History   Socioeconomic History  . Marital status: Single    Spouse name: Not on file   . Number of children: Not on file  . Years of education: Not on file  . Highest education level: Not on file  Occupational History  . Not on file  Social Needs  . Financial resource strain: Not on file  . Food insecurity:    Worry: Not on file    Inability: Not on file  . Transportation needs:    Medical: Not on file    Non-medical: Not on file  Tobacco Use  . Smoking status: Never Smoker  . Smokeless tobacco: Never Used  Substance and Sexual Activity  . Alcohol use: No    Alcohol/week: 0.0 standard drinks  . Drug use: No  . Sexual activity: Not Currently  Lifestyle  . Physical activity:    Days per week: Not on file    Minutes per session: Not on file  . Stress: Not on file  Relationships  . Social connections:    Talks on phone: Not on file    Gets together: Not on file    Attends religious service: Not on file    Active member of club or organization: Not on file    Attends meetings of clubs or organizations: Not on file    Relationship status: Not on file  Other Topics Concern  . Not on file  Social History Narrative  . Not on file    Allergies: No Known Allergies  Metabolic Disorder Labs: Lab  Results  Component Value Date   HGBA1C 7.7 (H) 03/08/2015   MPG 174 (H) 03/08/2015   MPG 105 02/23/2014   No results found for: PROLACTIN Lab Results  Component Value Date   CHOL 138 02/23/2014   TRIG 56 02/23/2014   HDL 37 (L) 02/23/2014   CHOLHDL 3.7 02/23/2014   VLDL 11 02/23/2014   LDLCALC 90 02/23/2014   Lab Results  Component Value Date   TSH 1.614 03/08/2015   TSH 2.641 02/23/2014    Therapeutic Level Labs: No results found for: LITHIUM No results found for: VALPROATE No components found for:  CBMZ  Current Medications: Current Outpatient Medications  Medication Sig Dispense Refill  . methylphenidate 36 MG PO CR tablet Take 1 tablet (36 mg total) by mouth every morning. 30 tablet 0  . NOVOLOG MIX 70/30 FLEXPEN (70-30) 100 UNIT/ML FlexPen   0   . sertraline (ZOLOFT) 50 MG tablet Take 1 tablet (50 mg total) by mouth daily. 30 tablet 2   No current facility-administered medications for this visit.      Musculoskeletal: Strength & Muscle Tone: within normal limits Gait & Station: normal Patient leans: N/A  Psychiatric Specialty Exam: ROS  Blood pressure 120/77, pulse (!) 109, height 5' 4.5" (1.638 m), weight 167 lb (75.8 kg), SpO2 97 %.Body mass index is 28.22 kg/m.  General Appearance: Fairly Groomed  Eye Contact:  Fair  Speech:  Slow  Volume:  Normal  Mood:  Anxious  Affect:  Congruent  Thought Process:  Goal Directed  Orientation:  Full (Time, Place, and Person)  Thought Content: Logical   Suicidal Thoughts:  No  Homicidal Thoughts:  No  Memory:  Immediate;   Good Recent;   Good Remote;   Good  Judgement:  Good  Insight:  Good  Psychomotor Activity:  Normal  Concentration:  Concentration: Fair and Attention Span: Fair  Recall:  [employer] of Knowledge: Fair  Language: Good  Akathisia:  No  Handed:  Right  AIMS (if indicated): not done  Assets:  Desire for Improvement Housing Resilience Transportation  ADL's:  Intact  Cognition: WNL  Sleep:  Good   Screenings: PHQ2-9     Nutrition from 01/22/2016 in [facility]  PHQ-2 Total Score  0       Assessment and Plan: Attention deficit disorder, inattentive type.  Generalized anxiety disorder.  Patient is stable on his current medication.  He has no issues or side effects.  Continue Concerta 36 mg daily and Zoloft 50 mg daily.  Patient is not interested in counseling.  Recommended to call us back if is any question, concern or if he feels worsening of the symptoms.  Follow-up in 3 months.   Cleotis Nipper, MD 10/26/2018, 4:13 PM

## 2018-11-27 DIAGNOSIS — F840 Autistic disorder: Secondary | ICD-10-CM | POA: Diagnosis not present

## 2018-12-15 ENCOUNTER — Telehealth (HOSPITAL_COMMUNITY): Payer: Self-pay

## 2018-12-15 DIAGNOSIS — F900 Attention-deficit hyperactivity disorder, predominantly inattentive type: Secondary | ICD-10-CM

## 2018-12-15 MED ORDER — METHYLPHENIDATE HCL ER (OSM) 36 MG PO TBCR: 36.0000 mg | EXTENDED_RELEASE_TABLET | ORAL | 0 refills | Status: DC

## 2018-12-15 NOTE — Telephone Encounter (Signed)
Sent Concerta to pharmacy. Reviewed [place] controlled substance database.  I have also reviewed Dr. Sheela StackArfeen's progress notes.

## 2018-12-15 NOTE — Telephone Encounter (Signed)
Patient is calling for a refill on his Concerta. Dr. Lolly MustacheArfeen is out until Monday and the patient is out of medication. He has a follow up appointment in Feb. Would you mind sending a 30 day refill into the patients pharmacy [facility] - on [address]

## 2018-12-28 DIAGNOSIS — F840 Autistic disorder: Secondary | ICD-10-CM | POA: Diagnosis not present

## 2019-01-15 ENCOUNTER — Telehealth (HOSPITAL_COMMUNITY): Payer: Self-pay

## 2019-01-15 DIAGNOSIS — F900 Attention-deficit hyperactivity disorder, predominantly inattentive type: Secondary | ICD-10-CM

## 2019-01-15 NOTE — Telephone Encounter (Signed)
Patient needs a refill on Methylphenidate 36mg . Please send refill to [facility]

## 2019-01-16 MED ORDER — METHYLPHENIDATE HCL ER (OSM) 36 MG PO TBCR: 36.0000 mg | EXTENDED_RELEASE_TABLET | ORAL | 0 refills | Status: DC

## 2019-01-16 NOTE — Telephone Encounter (Signed)
Prescription called in at [place], [address].

## 2019-01-24 ENCOUNTER — Encounter (HOSPITAL_COMMUNITY): Payer: Self-pay | Admitting: Psychiatry

## 2019-01-24 ENCOUNTER — Ambulatory Visit (INDEPENDENT_AMBULATORY_CARE_PROVIDER_SITE_OTHER): Payer: BLUE CROSS/BLUE SHIELD | Admitting: Psychiatry

## 2019-01-24 DIAGNOSIS — F411 Generalized anxiety disorder: Secondary | ICD-10-CM | POA: Diagnosis not present

## 2019-01-24 DIAGNOSIS — F900 Attention-deficit hyperactivity disorder, predominantly inattentive type: Secondary | ICD-10-CM

## 2019-01-24 MED ORDER — SERTRALINE HCL 50 MG PO TABS: 50.0000 mg | ORAL_TABLET | Freq: Every day | ORAL | 2 refills | Status: DC

## 2019-01-24 MED ORDER — METHYLPHENIDATE HCL ER (OSM) 36 MG PO TBCR: 36.0000 mg | EXTENDED_RELEASE_TABLET | ORAL | 0 refills | Status: DC

## 2019-01-24 NOTE — Progress Notes (Signed)
BH MD/PA/NP OP Progress Note  01/24/2019 4:24 PM Alexander Wilkerson  MRN:  [id number]  Chief Complaint: I am doing good.  I will start work in March.  HPI: Alexander Wilkerson came for his appointment.  He is taking his medication as prescribed.  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  He is taking his medication and he has no side effects.  He had a good Christmas.  He started walking every day for 20 to 30 minutes.  He lost 7 pounds since the last visit.  He denies any irritability, crying spells, feeling of hopelessness or worthlessness.  His anxiety is under control with Zoloft.  He has no tremors or shakes.  His attention and concentration is good.  He is able to do multitasking.  He see his parents on a regular basis.  His sister lives in [place] and he sometimes visit her to.  He denies drinking or using any illegal substances.  His energy level is good.  He denies any concern from the medication.  He has no insomnia, palpitation, chest pain.  Visit Diagnosis:    ICD-10-CM   1. Attention deficit hyperactivity disorder (ADHD), predominantly inattentive type F90.0 methylphenidate 36 MG PO CR tablet  2. Generalized anxiety disorder F41.1 sertraline (ZOLOFT) 50 MG tablet    Past Psychiatric History: Reviewed. H/O  ADHD. In the past year tried Adderall. He is taking Concerta and Zoloft since 2006.  Past Medical History:  Past Medical History:  Diagnosis Date  . Autism spectrum disorder   . Diabetes mellitus, type II [facility])     Past Surgical History:  Procedure Laterality Date  . WISDOM TOOTH EXTRACTION      Family Psychiatric History:.  Reviewed  Family History: No family history on file.  Social History:  Social History   Socioeconomic History  . Marital status: Single    Spouse name: Not on file  . Number of children: Not on file  . Years of education: Not on file  . Highest education level: Not on file  Occupational History  . Not on file   Social Needs  . Financial resource strain: Not on file  . Food insecurity:    Worry: Not on file    Inability: Not on file  . Transportation needs:    Medical: Not on file    Non-medical: Not on file  Tobacco Use  . Smoking status: Never Smoker  . Smokeless tobacco: Never Used  Substance and Sexual Activity  . Alcohol use: No    Alcohol/week: 0.0 standard drinks  . Drug use: No  . Sexual activity: Not Currently  Lifestyle  . Physical activity:    Days per week: Not on file    Minutes per session: Not on file  . Stress: Not on file  Relationships  . Social connections:    Talks on phone: Not on file    Gets together: Not on file    Attends religious service: Not on file    Active member of club or organization: Not on file    Attends meetings of clubs or organizations: Not on file    Relationship status: Not on file  Other Topics Concern  . Not on file  Social History Narrative  . Not on file    Allergies: No Known Allergies  Metabolic Disorder Labs: Lab Results  Component Value Date   HGBA1C 7.7 (H) 03/08/2015   MPG 174 (H) 03/08/2015   MPG  105 02/23/2014   No results found for: PROLACTIN Lab Results  Component Value Date   CHOL 138 02/23/2014   TRIG 56 02/23/2014   HDL 37 (L) 02/23/2014   CHOLHDL 3.7 02/23/2014   VLDL 11 02/23/2014   LDLCALC 90 02/23/2014   Lab Results  Component Value Date   TSH 1.614 03/08/2015   TSH 2.641 02/23/2014    Therapeutic Level Labs: No results found for: LITHIUM No results found for: VALPROATE No components found for:  CBMZ  Current Medications: Current Outpatient Medications  Medication Sig Dispense Refill  . methylphenidate 36 MG PO CR tablet Take 1 tablet (36 mg total) by mouth every morning. 30 tablet 0  . NOVOLOG MIX 70/30 FLEXPEN (70-30) 100 UNIT/ML FlexPen   0  . sertraline (ZOLOFT) 50 MG tablet Take 1 tablet (50 mg total) by mouth daily. 30 tablet 2   No current facility-administered medications for this  visit.      Musculoskeletal: Strength & Muscle Tone: within normal limits Gait & Station: normal Patient leans: N/A  Psychiatric Specialty Exam: ROS  Blood pressure 113/76, pulse (!) 106, height 5' 4.5" (1.638 m), weight 160 lb (72.6 kg), SpO2 97 %.Body mass index is 27.04 kg/m.  General Appearance: Casual  Eye Contact:  Good  Speech:  Clear and Coherent  Volume:  Normal  Mood:  Anxious  Affect:  Congruent  Thought Process:  Goal Directed  Orientation:  Full (Time, Place, and Person)  Thought Content: Logical   Suicidal Thoughts:  No  Homicidal Thoughts:  No  Memory:  Immediate;   Good Recent;   Good Remote;   Good  Judgement:  Good  Insight:  Good  Psychomotor Activity:  Normal  Concentration:  Concentration: Fair and Attention Span: Fair  Recall:  Good  Fund of Knowledge: Good  Language: Good  Akathisia:  No  Handed:  Right  AIMS (if indicated): not done  Assets:  Communication Skills Desire for Improvement Housing Resilience Social Support Talents/Skills  ADL's:  Intact  Cognition: WNL  Sleep:  Good   Screenings: PHQ2-9     Nutrition from 01/22/2016 in Nutrition [facility]  PHQ-2 Total Score  0       Assessment and Plan: Attention deficit hyperactivity disorder, inattentive type.  Generalized anxiety disorder.  Patient doing better on his medication.  Continue Concerta 36 mg daily and Zoloft 50 mg daily.  Patient is scheduled to see his endocrinologist in March for blood work including hemoglobin A1c.  Encouraged to continue walking on a regular basis.  Recommended to call us back if is any question or any concern.  Follow-up in 3 months.   Cleotis Nipper, MD 01/24/2019, 4:24 PM

## 2019-02-22 DIAGNOSIS — E1065 Type 1 diabetes mellitus with hyperglycemia: Secondary | ICD-10-CM | POA: Diagnosis not present

## 2019-02-22 DIAGNOSIS — E02 Subclinical iodine-deficiency hypothyroidism: Secondary | ICD-10-CM | POA: Diagnosis not present

## 2019-03-01 DIAGNOSIS — E1065 Type 1 diabetes mellitus with hyperglycemia: Secondary | ICD-10-CM | POA: Diagnosis not present

## 2019-03-01 DIAGNOSIS — E02 Subclinical iodine-deficiency hypothyroidism: Secondary | ICD-10-CM | POA: Diagnosis not present

## 2019-03-19 ENCOUNTER — Telehealth (HOSPITAL_COMMUNITY): Payer: Self-pay

## 2019-03-19 DIAGNOSIS — F900 Attention-deficit hyperactivity disorder, predominantly inattentive type: Secondary | ICD-10-CM

## 2019-03-19 MED ORDER — METHYLPHENIDATE HCL ER (OSM) 36 MG PO TBCR: 36.0000 mg | EXTENDED_RELEASE_TABLET | ORAL | 0 refills | Status: DC

## 2019-03-19 NOTE — Telephone Encounter (Signed)
done

## 2019-03-19 NOTE — Telephone Encounter (Signed)
Patient is calling for a refill on his Concerta 36 mg, patient was here in Feb. And has a follow up in May. Same pharmacy, please review and advise, thank you

## 2019-04-16 ENCOUNTER — Telehealth (HOSPITAL_COMMUNITY): Payer: Self-pay

## 2019-04-16 DIAGNOSIS — F900 Attention-deficit hyperactivity disorder, predominantly inattentive type: Secondary | ICD-10-CM

## 2019-04-16 MED ORDER — METHYLPHENIDATE HCL ER (OSM) 36 MG PO TBCR: 36.0000 mg | EXTENDED_RELEASE_TABLET | ORAL | 0 refills | Status: DC

## 2019-04-16 NOTE — Telephone Encounter (Signed)
Called in pharmacy 

## 2019-04-16 NOTE — Telephone Encounter (Signed)
Patient is calling for a refill on Concerta -  Follow up is 5/5 and the pharmacy in chart is correct

## 2019-04-24 ENCOUNTER — Ambulatory Visit (INDEPENDENT_AMBULATORY_CARE_PROVIDER_SITE_OTHER): Payer: BLUE CROSS/BLUE SHIELD | Admitting: Psychiatry

## 2019-04-24 ENCOUNTER — Other Ambulatory Visit: Payer: Self-pay

## 2019-04-24 ENCOUNTER — Encounter (HOSPITAL_COMMUNITY): Payer: Self-pay | Admitting: Psychiatry

## 2019-04-24 DIAGNOSIS — F411 Generalized anxiety disorder: Secondary | ICD-10-CM

## 2019-04-24 DIAGNOSIS — F900 Attention-deficit hyperactivity disorder, predominantly inattentive type: Secondary | ICD-10-CM

## 2019-04-24 MED ORDER — METHYLPHENIDATE HCL ER (OSM) 36 MG PO TBCR: 36.0000 mg | EXTENDED_RELEASE_TABLET | ORAL | 0 refills | Status: DC

## 2019-04-24 MED ORDER — SERTRALINE HCL 50 MG PO TABS: 50.0000 mg | ORAL_TABLET | Freq: Every day | ORAL | 2 refills | Status: DC

## 2019-04-24 NOTE — Progress Notes (Signed)
Virtual Visit via Telephone Note  I connected with Alexander Wilkerson on 04/24/19 at  4:20 PM EDT by telephone and verified that I am speaking with the correct person using two identifiers.   I discussed the limitations, risks, security and privacy concerns of performing an evaluation and management service by telephone and the availability of in person appointments. I also discussed with the patient that there may be a patient responsible charge related to this service. The patient expressed understanding and agreed to proceed.   History of Present Illness: Patient is evaluated through phone session.  He is taking his medication as prescribed.  Due to pandemic wires he was out of work but now started working full-time yesterday.  He recently seen his endocrinologist and his hemoglobin A1c was 8.5 and is not happy.  He admitted not watching his calorie and not doing exercise but he resumed walking.  Is happy that his sister bought a house in [place].  His parents live close by and they are going to [place] to help her move.  Overall he describes his anxiety is under control.  He denies any irritability, anger, crying spells or any mood swings.  His appetite is okay.  He reported his weight is stable.  He reported no tremors or shakes.  He like to continue his current medication.  He denies drinking or using any illegal substances.   Past Psychiatric History: Reviewed. H/O  ADHD. In the past year tried Adderall. He is taking Concerta and Zoloft since 2006.  Observations/Objective: Mental status examination done on the phone.  Patient described his mood euthymic.  His speech is slow but clear and coherent.  His thought process logical.  His attention concentration is fair.  There were no grandiosity or delusions.  He denies any auditory or visual hallucination.  He denies any active or passive suicidal thoughts or homicidal thought.  He is alert and oriented x3.  His cognition is good.  His fund of  knowledge is adequate.  His insight judgment is okay.  Assessment and Plan: Generalized anxiety disorder.  Attention deficit disorder, inattentive type.  Continue Concerta 36 mg daily and Zoloft 50 mg daily.  Discussed medication side effects and benefits.  Encourage healthy lifestyle and watch his calorie intake.  Discussed stimulant abuse, tolerance and dependency.  Recommended to call us back if is any question or any concern.  Follow-up in 3 months.  Follow Up Instructions:    I discussed the assessment and treatment plan with the patient. The patient was provided an opportunity to ask questions and all were answered. The patient agreed with the plan and demonstrated an understanding of the instructions.   The patient was advised to call back or seek an in-person evaluation if the symptoms worsen or if the condition fails to improve as anticipated.  I provided 15 minutes of non-face-to-face time during this encounter.   Cleotis Nipper, MD

## 2019-05-18 ENCOUNTER — Telehealth (HOSPITAL_COMMUNITY): Payer: Self-pay

## 2019-05-18 NOTE — Telephone Encounter (Signed)
Patient called to let us know he was running low on his Methylphenidate 36mg . I let him know that it was sent in on 04/24/19 and it could be filled and picked up on 05/19/19, receipt confirmed by pharmacy. Thank you.

## 2019-06-18 ENCOUNTER — Telehealth (HOSPITAL_COMMUNITY): Payer: Self-pay

## 2019-06-18 DIAGNOSIS — F900 Attention-deficit hyperactivity disorder, predominantly inattentive type: Secondary | ICD-10-CM

## 2019-06-18 MED ORDER — METHYLPHENIDATE HCL ER (OSM) 36 MG PO TBCR: 36.0000 mg | EXTENDED_RELEASE_TABLET | ORAL | 0 refills | Status: DC

## 2019-06-18 NOTE — Telephone Encounter (Signed)
Send to [facility], [address]

## 2019-06-18 NOTE — Telephone Encounter (Signed)
Patient called requesting a refill on his Methylphenidate 36mg . He stated he took his last one this morning and is completely out. Patient has scheduled appointment for 07/24/19.  He is using [facility] on the corner of  [address]. Please review and advise. Thank you.

## 2019-06-21 DIAGNOSIS — E1065 Type 1 diabetes mellitus with hyperglycemia: Secondary | ICD-10-CM | POA: Diagnosis not present

## 2019-07-18 ENCOUNTER — Telehealth (HOSPITAL_COMMUNITY): Payer: Self-pay

## 2019-07-18 DIAGNOSIS — F900 Attention-deficit hyperactivity disorder, predominantly inattentive type: Secondary | ICD-10-CM

## 2019-07-18 MED ORDER — METHYLPHENIDATE HCL ER (OSM) 36 MG PO TBCR: 36.0000 mg | EXTENDED_RELEASE_TABLET | ORAL | 0 refills | Status: DC

## 2019-07-18 NOTE — Telephone Encounter (Signed)
Done at [place] at [address].

## 2019-07-18 NOTE — Telephone Encounter (Signed)
Patient is calling for a refill on his Concerta, pharmacy in chart is correct.

## 2019-07-24 ENCOUNTER — Other Ambulatory Visit: Payer: Self-pay

## 2019-07-24 ENCOUNTER — Ambulatory Visit (HOSPITAL_COMMUNITY): Payer: BC Managed Care – PPO | Admitting: Psychiatry

## 2019-07-31 ENCOUNTER — Other Ambulatory Visit: Payer: Self-pay

## 2019-07-31 ENCOUNTER — Encounter (HOSPITAL_COMMUNITY): Payer: Self-pay | Admitting: Psychiatry

## 2019-07-31 ENCOUNTER — Ambulatory Visit (INDEPENDENT_AMBULATORY_CARE_PROVIDER_SITE_OTHER): Payer: BC Managed Care – PPO | Admitting: Psychiatry

## 2019-07-31 DIAGNOSIS — F900 Attention-deficit hyperactivity disorder, predominantly inattentive type: Secondary | ICD-10-CM

## 2019-07-31 DIAGNOSIS — F411 Generalized anxiety disorder: Secondary | ICD-10-CM | POA: Diagnosis not present

## 2019-07-31 MED ORDER — SERTRALINE HCL 50 MG PO TABS: 50.0000 mg | ORAL_TABLET | Freq: Every day | ORAL | 2 refills | Status: DC

## 2019-07-31 MED ORDER — METHYLPHENIDATE HCL ER (OSM) 36 MG PO TBCR: 36.0000 mg | EXTENDED_RELEASE_TABLET | ORAL | 0 refills | Status: DC

## 2019-07-31 NOTE — Progress Notes (Signed)
Virtual Visit via Telephone Note  I connected with Alexander Wilkerson on 07/31/19 at  3:40 PM EDT by telephone and verified that I am speaking with the correct person using two identifiers.   I discussed the limitations, risks, security and privacy concerns of performing an evaluation and management service by telephone and the availability of in person appointments. I also discussed with the patient that there may be a patient responsible charge related to this service. The patient expressed understanding and agreed to proceed.   History of Present Illness: Patient was evaluated through phone session.  He is taking his medication as prescribed.  Lately he is busy at work and some days he needs to go on the weekends.  He recently saw his endocrinologist Dr. Michiel Sites and his hemoglobin A1c was slightly up to 9.  His insulin doses were adjusted and he is hoping next follow-up in October will show less numbers.  Overall he describes his mood is good.  His anxiety is under control.  His attention, focus is better.  He is able to do multi tasking.  He denies any irritability, anger, crying spells or any feeling of hopelessness or worthlessness.  He is sleeping good.  He has no tremors, shakes, palpitation or any insomnia.  He denies drinking or using any illegal substances.  His appetite is okay.  His weight is stable.     Past Psychiatric History:Reviewed. H/OADHD. In the past year tried Adderall. He is taking Concerta and Zoloft since 2006.    Psychiatric Specialty Exam: Physical Exam  ROS  There were no vitals taken for this visit.There is no height or weight on file to calculate BMI.  General Appearance: NA  Eye Contact:  NA  Speech:  Slow  Volume:  Normal  Mood:  Euthymic  Affect:  NA  Thought Process:  Goal Directed  Orientation:  Full (Time, Place, and Person)  Thought Content:  WDL and Logical  Suicidal Thoughts:  No  Homicidal Thoughts:  No  Memory:  Immediate;   Good Recent;    Good Remote;   Good  Judgement:  Good  Insight:  Good  Psychomotor Activity:  NA  Concentration:  Concentration: Good and Attention Span: Good  Recall:  Good  Fund of Knowledge:  Good  Language:  Good  Akathisia:  No  Handed:  Right  AIMS (if indicated):     Assets:  Communication Skills Desire for Improvement Housing Resilience Social Support Talents/Skills Transportation  ADL's:  Intact  Cognition:  WNL  Sleep:         Assessment and Plan: Generalized anxiety disorder.  Attention deficit disorder, inattentive type.    Patient is a stable on his current medication.  Discussed medication side effects and benefits.  Continue Zoloft 50 mg daily and Concerta 36 mg daily.  Recommended to call us back if is any question raised concern.  Follow-up in 3 months.  Follow Up Instructions:    I discussed the assessment and treatment plan with the patient. The patient was provided an opportunity to ask questions and all were answered. The patient agreed with the plan and demonstrated an understanding of the instructions.   The patient was advised to call back or seek an in-person evaluation if the symptoms worsen or if the condition fails to improve as anticipated.  I provided 15 minutes of non-face-to-face time during this encounter.   [name], MD

## 2019-09-21 ENCOUNTER — Telehealth (HOSPITAL_COMMUNITY): Payer: Self-pay

## 2019-09-21 DIAGNOSIS — F900 Attention-deficit hyperactivity disorder, predominantly inattentive type: Secondary | ICD-10-CM

## 2019-09-21 NOTE — Telephone Encounter (Signed)
Patient is calling for a refill on his Concerta. [facility] [address]

## 2019-09-24 MED ORDER — METHYLPHENIDATE HCL ER (OSM) 36 MG PO TBCR: 36.0000 mg | EXTENDED_RELEASE_TABLET | ORAL | 0 refills | Status: DC

## 2019-09-24 NOTE — Telephone Encounter (Signed)
Send to [facility] at [address].

## 2019-09-25 DIAGNOSIS — E1065 Type 1 diabetes mellitus with hyperglycemia: Secondary | ICD-10-CM | POA: Diagnosis not present

## 2019-09-25 DIAGNOSIS — E02 Subclinical iodine-deficiency hypothyroidism: Secondary | ICD-10-CM | POA: Diagnosis not present

## 2019-10-24 ENCOUNTER — Telehealth (HOSPITAL_COMMUNITY): Payer: Self-pay

## 2019-10-24 DIAGNOSIS — F900 Attention-deficit hyperactivity disorder, predominantly inattentive type: Secondary | ICD-10-CM

## 2019-10-24 MED ORDER — METHYLPHENIDATE HCL ER (OSM) 36 MG PO TBCR: 36.0000 mg | EXTENDED_RELEASE_TABLET | ORAL | 0 refills | Status: DC

## 2019-10-24 NOTE — Telephone Encounter (Signed)
Done at [facility] at [address].

## 2019-10-24 NOTE — Telephone Encounter (Signed)
Patient is calling requesting Concerta refill, please review and advise, thank you

## 2019-10-31 ENCOUNTER — Encounter (HOSPITAL_COMMUNITY): Payer: Self-pay | Admitting: Psychiatry

## 2019-10-31 ENCOUNTER — Other Ambulatory Visit: Payer: Self-pay

## 2019-10-31 ENCOUNTER — Ambulatory Visit (INDEPENDENT_AMBULATORY_CARE_PROVIDER_SITE_OTHER): Payer: BC Managed Care – PPO | Admitting: Psychiatry

## 2019-10-31 DIAGNOSIS — F900 Attention-deficit hyperactivity disorder, predominantly inattentive type: Secondary | ICD-10-CM | POA: Diagnosis not present

## 2019-10-31 DIAGNOSIS — F411 Generalized anxiety disorder: Secondary | ICD-10-CM | POA: Diagnosis not present

## 2019-10-31 MED ORDER — METHYLPHENIDATE HCL ER (OSM) 36 MG PO TBCR: 36.0000 mg | EXTENDED_RELEASE_TABLET | ORAL | 0 refills | Status: DC

## 2019-10-31 MED ORDER — SERTRALINE HCL 50 MG PO TABS: 50.0000 mg | ORAL_TABLET | Freq: Every day | ORAL | 2 refills | Status: DC

## 2019-10-31 NOTE — Progress Notes (Signed)
Virtual Visit via Telephone Note  I connected with Alexander Wilkerson on 10/31/19 at  4:00 PM EST by telephone and verified that I am speaking with the correct person using two identifiers.   I discussed the limitations, risks, security and privacy concerns of performing an evaluation and management service by telephone and the availability of in person appointments. I also discussed with the patient that there may be a patient responsible charge related to this service. The patient expressed understanding and agreed to proceed.   History of Present Illness: Patient was evaluated by phone session.  He is taking his medication as prescribed.  Recently we have had blood work at his endocrinologist and he was pleased that his hemoglobin A1c dropped to 7.5.  He is feeling better.  He is active and watching his calorie intake.  His anxiety is stable.  He is worried about his sister was exposed to COVID-19.  Patient is able to do multitasking.  He is going to work every day.  He denies any agitation, anger, mania or any psychosis.  He feels the current medicine is working and does not want to change.  He has no tremors, shakes or any EPS.  His sleep is good.  He is not drinking or using any illegal substances.   Past Psychiatric History:Reviewed. H/OADHD. In the past year tried Adderall. He is taking Concerta and Zoloft since 2006.   Psychiatric Specialty Exam: Physical Exam  ROS  There were no vitals taken for this visit.There is no height or weight on file to calculate BMI.  General Appearance: NA  Eye Contact:  NA  Speech:  Clear and Coherent and Slow  Volume:  Normal  Mood:  Euthymic  Affect:  NA  Thought Process:  Goal Directed  Orientation:  Full (Time, Place, and Person)  Thought Content:  WDL and Logical  Suicidal Thoughts:  No  Homicidal Thoughts:  No  Memory:  Immediate;   Good Recent;   Good Remote;   Good  Judgement:  Good  Insight:  Good  Psychomotor Activity:  NA   Concentration:  Concentration: Good and Attention Span: Good  Recall:  Good  Fund of Knowledge:  Good  Language:  Good  Akathisia:  No  Handed:  Right  AIMS (if indicated):     Assets:  Communication Skills Desire for Improvement Housing Resilience Social Support Transportation  ADL's:  Intact  Cognition:  WNL  Sleep:   ok      Assessment and Plan: Generalized anxiety disorder.  Attention deficit disorder, inattentive type.  Patient is a stable on his current medication.  He does not drink or ask early refills for his stimulants.  Continue Zoloft 50 mg daily and Concerta 36 mg daily.  Recommended to call us back if he has any question of any concern.  He is pleased that his hemoglobin A1c is improved from the past.  Follow-up in 3 months.  Follow Up Instructions:    I discussed the assessment and treatment plan with the patient. The patient was provided an opportunity to ask questions and all were answered. The patient agreed with the plan and demonstrated an understanding of the instructions.   The patient was advised to call back or seek an in-person evaluation if the symptoms worsen or if the condition fails to improve as anticipated.  I provided 20 minutes of non-face-to-face time during this encounter.   [name], MD

## 2019-11-23 ENCOUNTER — Telehealth (HOSPITAL_COMMUNITY): Payer: Self-pay | Admitting: *Deleted

## 2019-11-23 NOTE — Telephone Encounter (Signed)
He has new prescription send to pharmacy to be given on 11/24/19. He need to call pharmacy.

## 2019-11-23 NOTE — Telephone Encounter (Signed)
Received request for refill of ADHD Medication.  Last refill date of med: 10/31/19 Medication: Methylphenidate 36mg  # dispensed: 30 # days of med left:: "a couple"  Last order says Do Not Refill until 11/24/19. Pt wanting to refill before weekend and office closed.

## 2019-12-17 DIAGNOSIS — E02 Subclinical iodine-deficiency hypothyroidism: Secondary | ICD-10-CM | POA: Diagnosis not present

## 2019-12-17 DIAGNOSIS — E1065 Type 1 diabetes mellitus with hyperglycemia: Secondary | ICD-10-CM | POA: Diagnosis not present

## 2019-12-25 ENCOUNTER — Telehealth (HOSPITAL_COMMUNITY): Payer: Self-pay | Admitting: *Deleted

## 2019-12-25 DIAGNOSIS — E1065 Type 1 diabetes mellitus with hyperglycemia: Secondary | ICD-10-CM | POA: Diagnosis not present

## 2019-12-25 DIAGNOSIS — E781 Pure hyperglyceridemia: Secondary | ICD-10-CM | POA: Diagnosis not present

## 2019-12-25 DIAGNOSIS — F900 Attention-deficit hyperactivity disorder, predominantly inattentive type: Secondary | ICD-10-CM

## 2019-12-25 DIAGNOSIS — E02 Subclinical iodine-deficiency hypothyroidism: Secondary | ICD-10-CM | POA: Diagnosis not present

## 2019-12-25 MED ORDER — METHYLPHENIDATE HCL ER (OSM) 36 MG PO TBCR: 36.0000 mg | EXTENDED_RELEASE_TABLET | ORAL | 0 refills | Status: DC

## 2019-12-25 NOTE — Telephone Encounter (Signed)
done

## 2019-12-25 NOTE — Telephone Encounter (Signed)
Pt called requesting a refill of the Concerta 36mg  last filled 10/31/19. Pt has an upcoming appointment on 01/31/20. Please review.

## 2020-01-24 ENCOUNTER — Telehealth (HOSPITAL_COMMUNITY): Payer: Self-pay | Admitting: *Deleted

## 2020-01-24 DIAGNOSIS — F900 Attention-deficit hyperactivity disorder, predominantly inattentive type: Secondary | ICD-10-CM

## 2020-01-24 NOTE — Telephone Encounter (Signed)
Received request for refill of ADHD Medication Concerta 36mg  qd written last on 12/25/19. Pt next appointment is on 01/31/20. Please review.

## 2020-01-25 MED ORDER — METHYLPHENIDATE HCL ER (OSM) 36 MG PO TBCR: 36.0000 mg | EXTENDED_RELEASE_TABLET | ORAL | 0 refills | Status: DC

## 2020-01-25 NOTE — Telephone Encounter (Signed)
Done

## 2020-01-28 ENCOUNTER — Other Ambulatory Visit (HOSPITAL_COMMUNITY): Payer: Self-pay | Admitting: *Deleted

## 2020-01-28 DIAGNOSIS — F411 Generalized anxiety disorder: Secondary | ICD-10-CM

## 2020-01-28 MED ORDER — SERTRALINE HCL 50 MG PO TABS: 50.0000 mg | ORAL_TABLET | Freq: Every day | ORAL | 0 refills | Status: DC

## 2020-01-31 ENCOUNTER — Ambulatory Visit (INDEPENDENT_AMBULATORY_CARE_PROVIDER_SITE_OTHER): Payer: BC Managed Care – PPO | Admitting: Psychiatry

## 2020-01-31 ENCOUNTER — Encounter (HOSPITAL_COMMUNITY): Payer: Self-pay | Admitting: Psychiatry

## 2020-01-31 ENCOUNTER — Other Ambulatory Visit: Payer: Self-pay

## 2020-01-31 DIAGNOSIS — F411 Generalized anxiety disorder: Secondary | ICD-10-CM

## 2020-01-31 DIAGNOSIS — F900 Attention-deficit hyperactivity disorder, predominantly inattentive type: Secondary | ICD-10-CM

## 2020-01-31 MED ORDER — METHYLPHENIDATE HCL ER (OSM) 36 MG PO TBCR: 36.0000 mg | EXTENDED_RELEASE_TABLET | ORAL | 0 refills | Status: DC

## 2020-01-31 MED ORDER — SERTRALINE HCL 50 MG PO TABS: 50.0000 mg | ORAL_TABLET | Freq: Every day | ORAL | 2 refills | Status: DC

## 2020-01-31 NOTE — Progress Notes (Signed)
Virtual Visit via Telephone Note  I connected with Arvilla Market on 01/31/20 at  4:00 PM EST by telephone and verified that I am speaking with the correct person using two identifiers.   I discussed the limitations, risks, security and privacy concerns of performing an evaluation and management service by telephone and the availability of in person appointments. I also discussed with the patient that there may be a patient responsible charge related to this service. The patient expressed understanding and agreed to proceed.   History of Present Illness: Patient was evaluated by phone session.  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  He is taking Vyvanse and Zoloft and he is able to function very well.  His attention, concentration is good.  He is able to do multitasking.  He is sleeping good and he denies any crying spells, feeling of hopelessness or worthlessness.  He recently he has blood work and he was pleased that his hemoglobin A1c further drop from 7.5-7.0.  However he is now taking levothyroxine since his TSH is also abnormal.  He had a good Christmas.  He has no tremors, shakes, palpitation.   Past Psychiatric History:Reviewed. H/OADHD. In the past year tried Adderall. He is taking Concerta and Zoloft since 2006.  Psychiatric Specialty Exam: Physical Exam  Review of Systems  There were no vitals taken for this visit.There is no height or weight on file to calculate BMI.  General Appearance: NA  Eye Contact:  NA  Speech:  Clear and Coherent and Slow  Volume:  Normal  Mood:  Euthymic  Affect:  NA  Thought Process:  Goal Directed  Orientation:  Full (Time, Place, and Person)  Thought Content:  WDL  Suicidal Thoughts:  No  Homicidal Thoughts:  No  Memory:  Immediate;   Good Recent;   Good Remote;   Good  Judgement:  Intact  Insight:  Present  Psychomotor Activity:  NA  Concentration:  Concentration: Good and Attention  Span: Good  Recall:  Good  Fund of Knowledge:  Good  Language:  Good  Akathisia:  No  Handed:  Right  AIMS (if indicated):     Assets:  Communication Skills Desire for Improvement Housing Resilience Social Support  ADL's:  Intact  Cognition:  WNL  Sleep:   ok      Assessment and Plan: Generalized anxiety disorder.  Attention deficit disorder, inattentive type.  Patient is stable on his medication.  Continue Zoloft 50 mg daily and Concerta 36 mg in the morning.  Discussed medication side effects and benefits.  Recommended to call us back if is any question or any concern.  Follow-up in 3 months.  Follow Up Instructions:    I discussed the assessment and treatment plan with the patient. The patient was provided an opportunity to ask questions and all were answered. The patient agreed with the plan and demonstrated an understanding of the instructions.   The patient was advised to call back or seek an in-person evaluation if the symptoms worsen or if the condition fails to improve as anticipated.  I provided 20 minutes of non-face-to-face time during this encounter.   Cleotis Nipper, MD

## 2020-02-21 DIAGNOSIS — E02 Subclinical iodine-deficiency hypothyroidism: Secondary | ICD-10-CM | POA: Diagnosis not present

## 2020-02-28 ENCOUNTER — Telehealth (HOSPITAL_COMMUNITY): Payer: Self-pay | Admitting: *Deleted

## 2020-02-28 NOTE — Telephone Encounter (Signed)
Pt called requesting a refill of the Concerta 36mg . Last written 01/31/20. Pt has an upcoming appointment on 05/01/20. Please review.

## 2020-02-28 NOTE — Telephone Encounter (Signed)
He should call pharmacy.  We have sent the prescription that was ready to pick up on March 5.

## 2020-02-29 ENCOUNTER — Ambulatory Visit: Payer: BC Managed Care – PPO | Attending: Internal Medicine

## 2020-02-29 DIAGNOSIS — Z23 Encounter for immunization: Secondary | ICD-10-CM

## 2020-02-29 NOTE — Progress Notes (Signed)
   Covid-19 Vaccination Clinic  Name:  Alexander Wilkerson    MRN: [id number] DOB: [birth year]  02/29/2020  Alexander Wilkerson was observed post Covid-19 immunization for 15 minutes without incident. He was provided with Vaccine Information Sheet and instruction to access the V-Safe system.   Alexander Wilkerson was instructed to call 911 with any severe reactions post vaccine: Marland Kitchen Difficulty breathing  . Swelling of face and throat  . A fast heartbeat  . A bad rash all over body  . Dizziness and weakness   Immunizations Administered    Name Date Dose VIS Date Route   Pfizer COVID-19 Vaccine 02/29/2020 11:37 AM 0.3 mL 11/30/2019 Intramuscular   Manufacturer: [employer], [employer]   Lot: [id number]   NDC: [address]-7

## 2020-03-24 ENCOUNTER — Ambulatory Visit: Payer: BC Managed Care – PPO | Attending: Internal Medicine

## 2020-03-24 ENCOUNTER — Ambulatory Visit: Payer: Self-pay

## 2020-03-24 DIAGNOSIS — Z23 Encounter for immunization: Secondary | ICD-10-CM

## 2020-03-24 NOTE — Progress Notes (Signed)
   Covid-19 Vaccination Clinic  Name:  Alexander Wilkerson    MRN: [id number] DOB: [birth year]  03/24/2020  [name] was observed post Covid-19 immunization for 15 minutes without incident. He was provided with Vaccine Information Sheet and instruction to access the V-Safe system.   [name] was instructed to call 911 with any severe reactions post vaccine: Marland Kitchen Difficulty breathing  . Swelling of face and throat  . A fast heartbeat  . A bad rash all over body  . Dizziness and weakness   Immunizations Administered    Name Date Dose VIS Date Route   Pfizer COVID-19 Vaccine 03/24/2020  5:26 PM 0.3 mL 11/30/2019 Intramuscular   Manufacturer: [employer], [employer]   Lot: [id number]   NDC: [address]-8

## 2020-04-07 ENCOUNTER — Other Ambulatory Visit (HOSPITAL_COMMUNITY): Payer: Self-pay

## 2020-04-07 DIAGNOSIS — F411 Generalized anxiety disorder: Secondary | ICD-10-CM

## 2020-04-07 MED ORDER — SERTRALINE HCL 50 MG PO TABS: 50.0000 mg | ORAL_TABLET | Freq: Every day | ORAL | 0 refills | Status: DC

## 2020-04-14 ENCOUNTER — Telehealth (HOSPITAL_COMMUNITY): Payer: Self-pay

## 2020-04-14 DIAGNOSIS — F900 Attention-deficit hyperactivity disorder, predominantly inattentive type: Secondary | ICD-10-CM

## 2020-04-14 MED ORDER — METHYLPHENIDATE HCL ER (OSM) 36 MG PO TBCR: 36.0000 mg | EXTENDED_RELEASE_TABLET | ORAL | 0 refills | Status: DC

## 2020-04-14 NOTE — Telephone Encounter (Signed)
Pt called requesting refill of methylphenidate tabs. States he is completely out of medication. Next appointment 5/13. If appropriate, please send to [facility] on [address]

## 2020-04-14 NOTE — Telephone Encounter (Signed)
Done

## 2020-04-30 ENCOUNTER — Other Ambulatory Visit: Payer: Self-pay

## 2020-04-30 ENCOUNTER — Telehealth (INDEPENDENT_AMBULATORY_CARE_PROVIDER_SITE_OTHER): Payer: BC Managed Care – PPO | Admitting: Psychiatry

## 2020-04-30 ENCOUNTER — Encounter (HOSPITAL_COMMUNITY): Payer: Self-pay | Admitting: Psychiatry

## 2020-04-30 DIAGNOSIS — F411 Generalized anxiety disorder: Secondary | ICD-10-CM | POA: Diagnosis not present

## 2020-04-30 DIAGNOSIS — F900 Attention-deficit hyperactivity disorder, predominantly inattentive type: Secondary | ICD-10-CM

## 2020-04-30 MED ORDER — METHYLPHENIDATE HCL ER (OSM) 36 MG PO TBCR: 36.0000 mg | EXTENDED_RELEASE_TABLET | ORAL | 0 refills | Status: DC

## 2020-04-30 MED ORDER — SERTRALINE HCL 50 MG PO TABS: 50.0000 mg | ORAL_TABLET | Freq: Every day | ORAL | 0 refills | Status: DC

## 2020-04-30 NOTE — Progress Notes (Signed)
Virtual Visit via Telephone Note  I connected with Arvilla Market on 04/30/20 at  4:00 PM EDT by telephone and verified that I am speaking with the correct person using two identifiers.   I discussed the limitations, risks, security and privacy concerns of performing an evaluation and management service by telephone and the availability of in person appointments. I also discussed with the patient that there may be a patient responsible charge related to this service. The patient expressed understanding and agreed to proceed.   History of Present Illness: Patient is evaluated by phone session.  She is back to work on April 1 and so far things are going well.  He is taking Concerta and Zoloft which is helping his attention, concentration, mood and depression.  He is sleeping good.  He is able to do multitasking.  He has no tremors or shakes or any EPS.  He has no concern from the medication.  He is happy that he received COVID vaccine and he has no side effects.  His family also vaccinated.  Patient like to keep his current medication.   Past Psychiatric History:Reviewed. H/OADHD. In the past year tried Adderall. He is taking Concerta and Zoloft since 2006.   Psychiatric Specialty Exam: Physical Exam  Review of Systems  There were no vitals taken for this visit.There is no height or weight on file to calculate BMI.  General Appearance: NA  Eye Contact:  NA  Speech:  Clear and Coherent  Volume:  Normal  Mood:  Euthymic  Affect:  NA  Thought Process:  Goal Directed  Orientation:  Full (Time, Place, and Person)  Thought Content:  Logical  Suicidal Thoughts:  No  Homicidal Thoughts:  No  Memory:  Immediate;   Good Recent;   Good Remote;   Good  Judgement:  Good  Insight:  Present  Psychomotor Activity:  NA  Concentration:  Concentration: Good and Attention Span: Good  Recall:  Good  Fund of Knowledge:  Good  Language:  Good  Akathisia:  No  Handed:  Right  AIMS (if  indicated):     Assets:  Communication Skills Desire for Improvement Housing Resilience Social Support Transportation  ADL's:  Intact  Cognition:  WNL  Sleep:   ok      Assessment and Plan: Generalized anxiety disorder.  Attention deficit disorder, inattentive type.  Patient is a stable on his medication.  Continue Zoloft 50 mg daily and Concerta 36 mg in the morning.  His energy level is good.  Encouraged healthy lifestyle.  Recommended to call us back if he has any questions or any concerns.  Discussed medication side effects and benefits.  Follow-up in 3 months.  Follow Up Instructions:    I discussed the assessment and treatment plan with the patient. The patient was provided an opportunity to ask questions and all were answered. The patient agreed with the plan and demonstrated an understanding of the instructions.   The patient was advised to call back or seek an in-person evaluation if the symptoms worsen or if the condition fails to improve as anticipated.  I provided 15 minutes of non-face-to-face time during this encounter.   Cleotis Nipper, MD

## 2020-05-01 ENCOUNTER — Ambulatory Visit (HOSPITAL_COMMUNITY): Payer: BC Managed Care – PPO | Admitting: Psychiatry

## 2020-06-04 ENCOUNTER — Telehealth (HOSPITAL_COMMUNITY): Payer: Self-pay

## 2020-06-04 DIAGNOSIS — F900 Attention-deficit hyperactivity disorder, predominantly inattentive type: Secondary | ICD-10-CM

## 2020-06-04 MED ORDER — METHYLPHENIDATE HCL ER (OSM) 36 MG PO TBCR: 36.0000 mg | EXTENDED_RELEASE_TABLET | ORAL | 0 refills | Status: DC

## 2020-06-04 NOTE — Telephone Encounter (Signed)
Called in but not due until 06/14/20

## 2020-06-04 NOTE — Telephone Encounter (Signed)
Received fax from [facility] on [address] in [place] requesting a refill on patient's Sertraline 50mg . Follow up appointment scheduled for 07/31/20. Thank you.

## 2020-06-05 NOTE — Telephone Encounter (Signed)
Ok thank you 

## 2020-06-06 ENCOUNTER — Telehealth (HOSPITAL_COMMUNITY): Payer: Self-pay

## 2020-06-06 ENCOUNTER — Other Ambulatory Visit (HOSPITAL_COMMUNITY): Payer: Self-pay

## 2020-06-06 DIAGNOSIS — F411 Generalized anxiety disorder: Secondary | ICD-10-CM

## 2020-06-06 MED ORDER — SERTRALINE HCL 50 MG PO TABS: 50.0000 mg | ORAL_TABLET | Freq: Every day | ORAL | 1 refills | Status: DC

## 2020-06-06 NOTE — Telephone Encounter (Signed)
Received fax from pharmacy requesting refill on patient's Sertraline 50mg . Patient has scheduled followup on 07/31/20. Sent in #30 with 1 refill to get patient to his appointment

## 2020-06-16 DIAGNOSIS — Z20822 Contact with and (suspected) exposure to covid-19: Secondary | ICD-10-CM | POA: Diagnosis not present

## 2020-06-18 DIAGNOSIS — E02 Subclinical iodine-deficiency hypothyroidism: Secondary | ICD-10-CM | POA: Diagnosis not present

## 2020-06-18 DIAGNOSIS — E1065 Type 1 diabetes mellitus with hyperglycemia: Secondary | ICD-10-CM | POA: Diagnosis not present

## 2020-06-25 DIAGNOSIS — E02 Subclinical iodine-deficiency hypothyroidism: Secondary | ICD-10-CM | POA: Diagnosis not present

## 2020-06-25 DIAGNOSIS — E1065 Type 1 diabetes mellitus with hyperglycemia: Secondary | ICD-10-CM | POA: Diagnosis not present

## 2020-06-25 DIAGNOSIS — E781 Pure hyperglyceridemia: Secondary | ICD-10-CM | POA: Diagnosis not present

## 2020-07-21 ENCOUNTER — Telehealth (HOSPITAL_COMMUNITY): Payer: Self-pay | Admitting: *Deleted

## 2020-07-21 DIAGNOSIS — F900 Attention-deficit hyperactivity disorder, predominantly inattentive type: Secondary | ICD-10-CM

## 2020-07-21 MED ORDER — METHYLPHENIDATE HCL ER (OSM) 36 MG PO TBCR: 36.0000 mg | EXTENDED_RELEASE_TABLET | ORAL | 0 refills | Status: DC

## 2020-07-21 NOTE — Telephone Encounter (Signed)
Done

## 2020-07-21 NOTE — Telephone Encounter (Signed)
Pt called requesting refill of Concerta 36mg  last ordered on 6/16 to be filled on the 26th.  Pt has an appointment on 07/31/20. Please review.

## 2020-07-28 DIAGNOSIS — E02 Subclinical iodine-deficiency hypothyroidism: Secondary | ICD-10-CM | POA: Diagnosis not present

## 2020-07-28 DIAGNOSIS — E1065 Type 1 diabetes mellitus with hyperglycemia: Secondary | ICD-10-CM | POA: Diagnosis not present

## 2020-07-28 DIAGNOSIS — E781 Pure hyperglyceridemia: Secondary | ICD-10-CM | POA: Diagnosis not present

## 2020-07-31 ENCOUNTER — Encounter (HOSPITAL_COMMUNITY): Payer: Self-pay | Admitting: Psychiatry

## 2020-07-31 ENCOUNTER — Other Ambulatory Visit: Payer: Self-pay

## 2020-07-31 ENCOUNTER — Telehealth (INDEPENDENT_AMBULATORY_CARE_PROVIDER_SITE_OTHER): Payer: BC Managed Care – PPO | Admitting: Psychiatry

## 2020-07-31 DIAGNOSIS — F411 Generalized anxiety disorder: Secondary | ICD-10-CM | POA: Diagnosis not present

## 2020-07-31 DIAGNOSIS — F900 Attention-deficit hyperactivity disorder, predominantly inattentive type: Secondary | ICD-10-CM | POA: Diagnosis not present

## 2020-07-31 MED ORDER — SERTRALINE HCL 50 MG PO TABS: 50.0000 mg | ORAL_TABLET | Freq: Every day | ORAL | 2 refills | Status: DC

## 2020-07-31 MED ORDER — METHYLPHENIDATE HCL ER (OSM) 36 MG PO TBCR: 36.0000 mg | EXTENDED_RELEASE_TABLET | ORAL | 0 refills | Status: DC

## 2020-07-31 NOTE — Progress Notes (Signed)
Virtual Visit via Telephone Note  I connected with Arvilla Market on 07/31/20 at  4:00 PM EDT by telephone and verified that I am speaking with the correct person using two identifiers.  Location: Patient: work Provider: home office   I discussed the limitations, risks, security and privacy concerns of performing an evaluation and management service by telephone and the availability of in person appointments. I also discussed with the patient that there may be a patient responsible charge related to this service. The patient expressed understanding and agreed to proceed.   History of Present Illness: Patient is evaluated by phone session.  He has been doing well on Concerta and Zoloft which is helping his anxiety, depression and attention and focus.  He is sleeping good.  He is happy since he is working full-time since April 1.  He has no tremors, shakes or any EPS.  He is able to do multitasking.  His living situation is unchanged.  He continues to visit his family member who is also vaccinated.  There has been no recent medication changes.  Patient like to keep his current medication.  He has no tremors, shakes or any EPS.    Past Psychiatric History:Reviewed. H/OADHD. In the past year tried Adderall. He is taking Concerta and Zoloft since 2006.   Psychiatric Specialty Exam: Physical Exam  Review of Systems  There were no vitals taken for this visit.There is no height or weight on file to calculate BMI.  General Appearance: NA  Eye Contact:  NA  Speech:  Slow  Volume:  Decreased  Mood:  Euthymic  Affect:  NA  Thought Process:  Goal Directed  Orientation:  Full (Time, Place, and Person)  Thought Content:  Logical  Suicidal Thoughts:  No  Homicidal Thoughts:  No  Memory:  Immediate;   Good Recent;   Good Remote;   Good  Judgement:  Good  Insight:  Good  Psychomotor Activity:  NA  Concentration:  Concentration: Fair and Attention Span: Good  Recall:  Good  Fund of  Knowledge:  Good  Language:  Good  Akathisia:  No  Handed:  Right  AIMS (if indicated):     Assets:  Communication Skills Desire for Improvement Housing Resilience Social Support Talents/Skills Transportation  ADL's:  Intact  Cognition:  WNL  Sleep:   Good      Assessment and Plan: Generalized anxiety disorder.  Attention deficit disorder, inattentive type.  Patient doing well on his current medication.  Continue Zoloft 50 mg daily and Concerta 36 mg in the morning.  Discussed medication side effects and benefits.  Recommended to call us back if there is any question or any concern.  Follow-up in 3 months.  Follow Up Instructions:    I discussed the assessment and treatment plan with the patient. The patient was provided an opportunity to ask questions and all were answered. The patient agreed with the plan and demonstrated an understanding of the instructions.   The patient was advised to call back or seek an in-person evaluation if the symptoms worsen or if the condition fails to improve as anticipated.  I provided 15 minutes of non-face-to-face time during this encounter.   Cleotis Nipper, MD

## 2020-08-03 ENCOUNTER — Other Ambulatory Visit (HOSPITAL_COMMUNITY): Payer: Self-pay | Admitting: Psychiatry

## 2020-08-03 DIAGNOSIS — F411 Generalized anxiety disorder: Secondary | ICD-10-CM

## 2020-08-26 ENCOUNTER — Telehealth (HOSPITAL_COMMUNITY): Payer: Self-pay | Admitting: *Deleted

## 2020-08-26 DIAGNOSIS — F900 Attention-deficit hyperactivity disorder, predominantly inattentive type: Secondary | ICD-10-CM

## 2020-08-26 MED ORDER — METHYLPHENIDATE HCL ER (OSM) 36 MG PO TBCR: 36.0000 mg | EXTENDED_RELEASE_TABLET | ORAL | 0 refills | Status: DC

## 2020-08-26 NOTE — Telephone Encounter (Signed)
Patient called and requested refill of Concerta be sent to [facility] on [address].  His next appt is 10/27/20. Pleases review.

## 2020-08-26 NOTE — Telephone Encounter (Signed)
Done

## 2020-09-26 ENCOUNTER — Telehealth (HOSPITAL_COMMUNITY): Payer: Self-pay | Admitting: *Deleted

## 2020-09-26 DIAGNOSIS — F900 Attention-deficit hyperactivity disorder, predominantly inattentive type: Secondary | ICD-10-CM

## 2020-09-26 MED ORDER — METHYLPHENIDATE HCL ER (OSM) 36 MG PO TBCR: 36.0000 mg | EXTENDED_RELEASE_TABLET | ORAL | 0 refills | Status: DC

## 2020-09-26 NOTE — Telephone Encounter (Signed)
Pt called requesting a refill of the Concerta. Pt next appointment on 11/11. Please review.

## 2020-09-26 NOTE — Telephone Encounter (Signed)
Done

## 2020-10-27 ENCOUNTER — Telehealth (HOSPITAL_COMMUNITY): Payer: BC Managed Care – PPO | Admitting: Psychiatry

## 2020-10-30 ENCOUNTER — Other Ambulatory Visit: Payer: Self-pay

## 2020-10-30 ENCOUNTER — Encounter (HOSPITAL_COMMUNITY): Payer: Self-pay | Admitting: Psychiatry

## 2020-10-30 ENCOUNTER — Telehealth (INDEPENDENT_AMBULATORY_CARE_PROVIDER_SITE_OTHER): Payer: BC Managed Care – PPO | Admitting: Psychiatry

## 2020-10-30 ENCOUNTER — Other Ambulatory Visit (HOSPITAL_COMMUNITY): Payer: Self-pay | Admitting: Psychiatry

## 2020-10-30 DIAGNOSIS — F411 Generalized anxiety disorder: Secondary | ICD-10-CM

## 2020-10-30 DIAGNOSIS — F900 Attention-deficit hyperactivity disorder, predominantly inattentive type: Secondary | ICD-10-CM | POA: Diagnosis not present

## 2020-10-30 MED ORDER — METHYLPHENIDATE HCL ER (OSM) 36 MG PO TBCR: 36.0000 mg | EXTENDED_RELEASE_TABLET | ORAL | 0 refills | Status: DC

## 2020-10-30 MED ORDER — SERTRALINE HCL 50 MG PO TABS: 50.0000 mg | ORAL_TABLET | Freq: Every day | ORAL | 2 refills | Status: DC

## 2020-10-30 NOTE — Progress Notes (Signed)
Virtual Visit via Telephone Note  I connected with Arvilla Market on 10/30/20 at  4:00 PM EST by telephone and verified that I am speaking with the correct person using two identifiers.  Location: Patient: Home Provider: Home office   I discussed the limitations, risks, security and privacy concerns of performing an evaluation and management service by telephone and the availability of in person appointments. I also discussed with the patient that there may be a patient responsible charge related to this service. The patient expressed understanding and agreed to proceed.   History of Present Illness: Patient is evaluated by phone session.  He is taking his medication as prescribed.  His job is going well.  He is sleeping good and denies any anxiety, nervousness or any feeling of hopelessness.  She has no tremors or shakes.  Her attention, focus is good.  She is able to do multitasking without any issues.  Her weight is stable.  She denies drinking or using any illegal substances.  Patient is working full-time.  He is looking forward to have good holidays.   Past Psychiatric History:Reviewed. H/OADHD. In the past year tried Adderall. He is taking Concerta and Zoloft since 2006.  Psychiatric Specialty Exam: Physical Exam  Review of Systems  Weight 160 lb (72.6 kg).There is no height or weight on file to calculate BMI.  General Appearance: NA  Eye Contact:  NA  Speech:  Slow  Volume:  Decreased  Mood:  Euthymic  Affect:  NA  Thought Process:  Goal Directed  Orientation:  Full (Time, Place, and Person)  Thought Content:  Logical  Suicidal Thoughts:  No  Homicidal Thoughts:  No  Memory:  Immediate;   Good Recent;   Good Remote;   Good  Judgement:  Intact  Insight:  Present  Psychomotor Activity:  NA  Concentration:  Concentration: Good and Attention Span: Good  Recall:  Good  Fund of Knowledge:  Good  Language:  Good  Akathisia:  No  Handed:  Right  AIMS (if indicated):      Assets:  Communication Skills Desire for Improvement Housing Resilience Social Support Talents/Skills Transportation  ADL's:  Intact  Cognition:  WNL  Sleep:   good      Assessment and Plan: Generalized anxiety disorder.  Attention deficit disorder, inattentive type.  Patient is a stable on his current medication.  Continue Zoloft 50 mg daily and Concerta 36 mg in the morning.  Discussed medication side effects and benefits.  Recommended to call us back if is any question or any concern.  Follow-up in 3 months.  Follow Up Instructions:    I discussed the assessment and treatment plan with the patient. The patient was provided an opportunity to ask questions and all were answered. The patient agreed with the plan and demonstrated an understanding of the instructions.   The patient was advised to call back or seek an in-person evaluation if the symptoms worsen or if the condition fails to improve as anticipated.  I provided 12 minutes of non-face-to-face time during this encounter.   Cleotis Nipper, MD

## 2020-11-19 DIAGNOSIS — E02 Subclinical iodine-deficiency hypothyroidism: Secondary | ICD-10-CM | POA: Diagnosis not present

## 2020-11-19 DIAGNOSIS — E1065 Type 1 diabetes mellitus with hyperglycemia: Secondary | ICD-10-CM | POA: Diagnosis not present

## 2020-11-19 DIAGNOSIS — E781 Pure hyperglyceridemia: Secondary | ICD-10-CM | POA: Diagnosis not present

## 2020-11-26 DIAGNOSIS — E1065 Type 1 diabetes mellitus with hyperglycemia: Secondary | ICD-10-CM | POA: Diagnosis not present

## 2020-11-26 DIAGNOSIS — E781 Pure hyperglyceridemia: Secondary | ICD-10-CM | POA: Diagnosis not present

## 2020-11-26 DIAGNOSIS — E02 Subclinical iodine-deficiency hypothyroidism: Secondary | ICD-10-CM | POA: Diagnosis not present

## 2020-12-01 ENCOUNTER — Telehealth (HOSPITAL_COMMUNITY): Payer: Self-pay | Admitting: *Deleted

## 2020-12-01 DIAGNOSIS — F900 Attention-deficit hyperactivity disorder, predominantly inattentive type: Secondary | ICD-10-CM

## 2020-12-01 MED ORDER — METHYLPHENIDATE HCL ER (OSM) 36 MG PO TBCR: 36.0000 mg | EXTENDED_RELEASE_TABLET | ORAL | 0 refills | Status: DC

## 2020-12-01 NOTE — Telephone Encounter (Signed)
Done

## 2020-12-01 NOTE — Telephone Encounter (Signed)
Pt called requesting refill on the Concerta. Pt has an upcoming appointment on 01/28/21.

## 2021-01-01 ENCOUNTER — Telehealth (HOSPITAL_COMMUNITY): Payer: Self-pay | Admitting: *Deleted

## 2021-01-01 DIAGNOSIS — F900 Attention-deficit hyperactivity disorder, predominantly inattentive type: Secondary | ICD-10-CM

## 2021-01-01 MED ORDER — METHYLPHENIDATE HCL ER (OSM) 36 MG PO TBCR: 36.0000 mg | EXTENDED_RELEASE_TABLET | ORAL | 0 refills | Status: DC

## 2021-01-01 NOTE — Telephone Encounter (Signed)
Done

## 2021-01-01 NOTE — Telephone Encounter (Signed)
Pt called requesting refill on the Concerta last written 12/01/20. Pt has an upcoming appointment on 01/28/21.

## 2021-01-01 NOTE — Telephone Encounter (Signed)
Pt called requesting a refill on the Concerta 36 CR last rx on 12/01/20. Pt ha san appointment on 01/28/21. Thanks.

## 2021-01-28 ENCOUNTER — Encounter (HOSPITAL_COMMUNITY): Payer: Self-pay | Admitting: Psychiatry

## 2021-01-28 ENCOUNTER — Telehealth (INDEPENDENT_AMBULATORY_CARE_PROVIDER_SITE_OTHER): Payer: BC Managed Care – PPO | Admitting: Psychiatry

## 2021-01-28 ENCOUNTER — Other Ambulatory Visit: Payer: Self-pay

## 2021-01-28 DIAGNOSIS — F900 Attention-deficit hyperactivity disorder, predominantly inattentive type: Secondary | ICD-10-CM | POA: Diagnosis not present

## 2021-01-28 DIAGNOSIS — E1065 Type 1 diabetes mellitus with hyperglycemia: Secondary | ICD-10-CM | POA: Insufficient documentation

## 2021-01-28 DIAGNOSIS — F411 Generalized anxiety disorder: Secondary | ICD-10-CM

## 2021-01-28 DIAGNOSIS — E02 Subclinical iodine-deficiency hypothyroidism: Secondary | ICD-10-CM | POA: Insufficient documentation

## 2021-01-28 MED ORDER — METHYLPHENIDATE HCL ER (OSM) 36 MG PO TBCR: 36.0000 mg | EXTENDED_RELEASE_TABLET | ORAL | 0 refills | Status: DC

## 2021-01-28 MED ORDER — SERTRALINE HCL 50 MG PO TABS: 50.0000 mg | ORAL_TABLET | Freq: Every day | ORAL | 2 refills | Status: DC

## 2021-01-28 NOTE — Progress Notes (Signed)
Virtual Visit via Telephone Note  I connected with Alexander Wilkerson on 01/28/21 at  4:00 PM EST by telephone and verified that I am speaking with the correct person using two identifiers.  Location: Patient: Home Provider: Home Office   I discussed the limitations, risks, security and privacy concerns of performing an evaluation and management service by telephone and the availability of in person appointments. I also discussed with the patient that there may be a patient responsible charge related to this service. The patient expressed understanding and agreed to proceed.   History of Present Illness: Patient is evaluated by phone session.  He is taking Zoloft and stimulant.  He reported his anxiety and focus is good.  He is scheduled to restart work on April 1.  His job is seasonal.  He had a good vacation and recently he had a trip to [place] with the family and he had a good time.  He is sleeping good.  He denies any panic attack, nervousness and does not feel uncomfortable around public places.  He is able to handle his anxiety and denies any major panic attack.  He is able to do multitasking and denies any tremors, shakes or any EPS.  His appetite is okay.  Recently he had blood work at his endocrinologist office.  His hemoglobin A1c is 7.5 which he was told improved from the past.  Like to keep his current medication.    Past Psychiatric History:Reviewed. H/OADHD. In the past year tried Adderall. He is taking Concerta and Zoloft since 2006.   Psychiatric Specialty Exam: Physical Exam  Review of Systems  Weight 160 lb (72.6 kg).There is no height or weight on file to calculate BMI.  General Appearance: NA  Eye Contact:  NA  Speech:  Slow  Volume:  Normal  Mood:  Euthymic  Affect:  NA  Thought Process:  Goal Directed  Orientation:  Full (Time, Place, and Person)  Thought Content:  WDL  Suicidal Thoughts:  No  Homicidal Thoughts:  No  Memory:  Immediate;   Good Recent;    Good Remote;   Good  Judgement:  Good  Insight:  Good  Psychomotor Activity:  NA  Concentration:  Concentration: Good and Attention Span: Good  Recall:  Good  Fund of Knowledge:  Good  Language:  Good  Akathisia:  No  Handed:  Right  AIMS (if indicated):     Assets:  Communication Skills Desire for Improvement Housing Resilience Social Support Talents/Skills Transportation  ADL's:  Intact  Cognition:  WNL  Sleep:   ok      Assessment and Plan: Generalized anxiety disorder.  Attention deficit disorder, inattentive type.  Patient is a stable on his current medication.  He does not want to change since it is working well.  I will continue Zoloft 50 mg daily and Concerta 36 mg daily.  Discussed medication side effects and benefits.  Recommended to call us back if is any question or any concern.  Follow-up in 3 months.  Follow Up Instructions:    I discussed the assessment and treatment plan with the patient. The patient was provided an opportunity to ask questions and all were answered. The patient agreed with the plan and demonstrated an understanding of the instructions.   The patient was advised to call back or seek an in-person evaluation if the symptoms worsen or if the condition fails to improve as anticipated.  I provided 13 minutes of non-face-to-face time during this encounter.  [name], MD

## 2021-01-29 ENCOUNTER — Other Ambulatory Visit (HOSPITAL_COMMUNITY): Payer: Self-pay | Admitting: Psychiatry

## 2021-01-29 DIAGNOSIS — F411 Generalized anxiety disorder: Secondary | ICD-10-CM

## 2021-03-10 ENCOUNTER — Telehealth (HOSPITAL_COMMUNITY): Payer: Self-pay | Admitting: *Deleted

## 2021-03-10 DIAGNOSIS — F900 Attention-deficit hyperactivity disorder, predominantly inattentive type: Secondary | ICD-10-CM

## 2021-03-10 NOTE — Telephone Encounter (Signed)
Pt called requesting a refill of the Methylphenidate 36mg  CR. Pt has an appointment scheduled for 04/27/21. Please review.

## 2021-03-11 MED ORDER — METHYLPHENIDATE HCL ER (OSM) 36 MG PO TBCR: 36.0000 mg | EXTENDED_RELEASE_TABLET | ORAL | 0 refills | Status: DC

## 2021-03-11 NOTE — Telephone Encounter (Signed)
Done

## 2021-04-09 ENCOUNTER — Telehealth (HOSPITAL_COMMUNITY): Payer: Self-pay | Admitting: *Deleted

## 2021-04-09 DIAGNOSIS — F900 Attention-deficit hyperactivity disorder, predominantly inattentive type: Secondary | ICD-10-CM

## 2021-04-09 MED ORDER — METHYLPHENIDATE HCL ER (OSM) 36 MG PO TBCR: 36.0000 mg | EXTENDED_RELEASE_TABLET | ORAL | 0 refills | Status: DC

## 2021-04-09 NOTE — Telephone Encounter (Signed)
Pt called requesting a refill of the Concerta. Pt has an upcoming appointment on 04/27/21.

## 2021-04-09 NOTE — Telephone Encounter (Signed)
Done

## 2021-04-26 ENCOUNTER — Other Ambulatory Visit (HOSPITAL_COMMUNITY): Payer: Self-pay | Admitting: Psychiatry

## 2021-04-26 DIAGNOSIS — F411 Generalized anxiety disorder: Secondary | ICD-10-CM

## 2021-04-27 ENCOUNTER — Encounter (HOSPITAL_COMMUNITY): Payer: Self-pay | Admitting: Psychiatry

## 2021-04-27 ENCOUNTER — Telehealth (INDEPENDENT_AMBULATORY_CARE_PROVIDER_SITE_OTHER): Payer: BC Managed Care – PPO | Admitting: Psychiatry

## 2021-04-27 ENCOUNTER — Other Ambulatory Visit: Payer: Self-pay

## 2021-04-27 VITALS — Wt 160.0 lb

## 2021-04-27 DIAGNOSIS — F900 Attention-deficit hyperactivity disorder, predominantly inattentive type: Secondary | ICD-10-CM | POA: Diagnosis not present

## 2021-04-27 DIAGNOSIS — F411 Generalized anxiety disorder: Secondary | ICD-10-CM

## 2021-04-27 MED ORDER — SERTRALINE HCL 50 MG PO TABS: 50.0000 mg | ORAL_TABLET | Freq: Every day | ORAL | 2 refills | Status: DC

## 2021-04-27 NOTE — Progress Notes (Signed)
Virtual Visit via Telephone Note  I connected with Alexander Wilkerson on 04/27/21 at  4:00 PM EDT by telephone and verified that I am speaking with the correct person using two identifiers.  Location: Patient: In Car Provider: Home Office   I discussed the limitations, risks, security and privacy concerns of performing an evaluation and management service by telephone and the availability of in person appointments. I also discussed with the patient that there may be a patient responsible charge related to this service. The patient expressed understanding and agreed to proceed.   History of Present Illness: Patient is evaluated by phone session.  He is taking his medication as prescribed.  He is back to work and so far her job is going well.  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  He does not reported any concern from the medication.  He denies any panic attack.  He feels he is able to do multitasking and his attention and concentration is good.  He denies any insomnia.  He has appointment to see his endocrinologist Dr. Romero Belling in June for his hemoglobin A1c.  Patient like to keep his current medication.  Past Psychiatric History:Reviewed. H/OADHD. In the past year tried Adderall. He is taking Concerta and Zoloft since 2006.   Psychiatric Specialty Exam: Physical Exam  Review of Systems  Weight 160 lb (72.6 kg).There is no height or weight on file to calculate BMI.  General Appearance: NA  Eye Contact:  NA  Speech:  Slow  Volume:  Decreased  Mood:  Euthymic  Affect:  NA  Thought Process:  Goal Directed  Orientation:  Full (Time, Place, and Person)  Thought Content:  WDL  Suicidal Thoughts:  No  Homicidal Thoughts:  No  Memory:  Immediate;   Good Recent;   Good Remote;   Good  Judgement:  Intact  Insight:  Present  Psychomotor Activity:  NA  Concentration:  Concentration: Fair and Attention Span:  Fair  Recall:  Good  Fund of Knowledge:  Good  Language:  Good  Akathisia:  No  Handed:  Right  AIMS (if indicated):     Assets:  Communication Skills Desire for Improvement Housing Resilience Social Support Talents/Skills Transportation  ADL's:  Intact  Cognition:  WNL  Sleep:   ok      Assessment and Plan: Generalized anxiety disorder.  Attention deficit disorder, inattentive type.  Patient is stable on his current medication.  Continue Zoloft 50 mg daily and Concerta 36 mg daily.  Recommended to call us back if is any question or any concern.  Follow-up in 3 months.  Follow Up Instructions:    I discussed the assessment and treatment plan with the patient. The patient was provided an opportunity to ask questions and all were answered. The patient agreed with the plan and demonstrated an understanding of the instructions.   The patient was advised to call back or seek an in-person evaluation if the symptoms worsen or if the condition fails to improve as anticipated.  I provided 13 minutes of non-face-to-face time during this encounter.   Cleotis Nipper, MD

## 2021-05-12 ENCOUNTER — Telehealth (HOSPITAL_COMMUNITY): Payer: Self-pay | Admitting: *Deleted

## 2021-05-12 DIAGNOSIS — F900 Attention-deficit hyperactivity disorder, predominantly inattentive type: Secondary | ICD-10-CM

## 2021-05-12 MED ORDER — METHYLPHENIDATE HCL ER (OSM) 36 MG PO TBCR: 36.0000 mg | EXTENDED_RELEASE_TABLET | ORAL | 0 refills | Status: DC

## 2021-05-12 NOTE — Telephone Encounter (Signed)
Done

## 2021-05-12 NOTE — Telephone Encounter (Signed)
Pt called to request a refill of the Methylphenidate 36 mg CR. Pt has an upcoming appointment on 07/30/21.

## 2021-05-20 DIAGNOSIS — E781 Pure hyperglyceridemia: Secondary | ICD-10-CM | POA: Diagnosis not present

## 2021-05-20 DIAGNOSIS — E02 Subclinical iodine-deficiency hypothyroidism: Secondary | ICD-10-CM | POA: Diagnosis not present

## 2021-05-20 DIAGNOSIS — E1065 Type 1 diabetes mellitus with hyperglycemia: Secondary | ICD-10-CM | POA: Diagnosis not present

## 2021-05-27 DIAGNOSIS — E781 Pure hyperglyceridemia: Secondary | ICD-10-CM | POA: Diagnosis not present

## 2021-05-27 DIAGNOSIS — E1065 Type 1 diabetes mellitus with hyperglycemia: Secondary | ICD-10-CM | POA: Diagnosis not present

## 2021-05-27 DIAGNOSIS — E02 Subclinical iodine-deficiency hypothyroidism: Secondary | ICD-10-CM | POA: Diagnosis not present

## 2021-06-09 ENCOUNTER — Telehealth (HOSPITAL_COMMUNITY): Payer: Self-pay | Admitting: *Deleted

## 2021-06-09 DIAGNOSIS — F900 Attention-deficit hyperactivity disorder, predominantly inattentive type: Secondary | ICD-10-CM

## 2021-06-09 MED ORDER — METHYLPHENIDATE HCL ER (OSM) 36 MG PO TBCR: 36.0000 mg | EXTENDED_RELEASE_TABLET | ORAL | 0 refills | Status: DC

## 2021-06-09 NOTE — Telephone Encounter (Signed)
Done. He can pick up on 06/12/21 when it is due.

## 2021-06-09 NOTE — Telephone Encounter (Signed)
Pt called requesting a refill of the Concerta. Last filled 05/12/21. Pt next appointment is scheduled for 07/30/21. Please review.

## 2021-07-20 ENCOUNTER — Telehealth (HOSPITAL_COMMUNITY): Payer: Self-pay

## 2021-07-20 ENCOUNTER — Other Ambulatory Visit (HOSPITAL_COMMUNITY): Payer: Self-pay | Admitting: Psychiatry

## 2021-07-20 DIAGNOSIS — F900 Attention-deficit hyperactivity disorder, predominantly inattentive type: Secondary | ICD-10-CM

## 2021-07-20 MED ORDER — METHYLPHENIDATE HCL ER (OSM) 36 MG PO TBCR: 36.0000 mg | EXTENDED_RELEASE_TABLET | ORAL | 0 refills | Status: DC

## 2021-07-20 NOTE — Telephone Encounter (Addendum)
This is Dr. Sheela Stack patient. Patient called requesting a refill on his Methylphenidate 36mg . He uses [facility] on [address]. Please review and advise. Thank you  NOTIFIED PATIENT - LVM

## 2021-07-20 NOTE — Telephone Encounter (Signed)
sent 

## 2021-07-27 ENCOUNTER — Other Ambulatory Visit (HOSPITAL_COMMUNITY): Payer: Self-pay | Admitting: Psychiatry

## 2021-07-27 DIAGNOSIS — F411 Generalized anxiety disorder: Secondary | ICD-10-CM

## 2021-07-30 ENCOUNTER — Other Ambulatory Visit: Payer: Self-pay

## 2021-07-30 ENCOUNTER — Encounter (HOSPITAL_COMMUNITY): Payer: Self-pay | Admitting: Psychiatry

## 2021-07-30 ENCOUNTER — Telehealth (INDEPENDENT_AMBULATORY_CARE_PROVIDER_SITE_OTHER): Payer: BC Managed Care – PPO | Admitting: Psychiatry

## 2021-07-30 DIAGNOSIS — F411 Generalized anxiety disorder: Secondary | ICD-10-CM

## 2021-07-30 DIAGNOSIS — F900 Attention-deficit hyperactivity disorder, predominantly inattentive type: Secondary | ICD-10-CM

## 2021-07-30 MED ORDER — METHYLPHENIDATE HCL ER (OSM) 36 MG PO TBCR: 36.0000 mg | EXTENDED_RELEASE_TABLET | ORAL | 0 refills | Status: DC

## 2021-07-30 MED ORDER — SERTRALINE HCL 50 MG PO TABS: 50.0000 mg | ORAL_TABLET | Freq: Every day | ORAL | 2 refills | Status: DC

## 2021-07-30 NOTE — Progress Notes (Signed)
Virtual Visit via Telephone Note  I connected with Alexander Wilkerson on 07/30/21 at  4:00 PM EDT by telephone and verified that I am speaking with the correct person using two identifiers.  Location: Patient: In Car Provider: Office   I discussed the limitations, risks, security and privacy concerns of performing an evaluation and management service by telephone and the availability of in person appointments. I also discussed with the patient that there may be a patient responsible charge related to this service. The patient expressed understanding and agreed to proceed.   History of Present Illness: Patient is evaluated by phone session.  He reported things are going well.  His depression anxiety is under control.  His job is going well as he feels no issues completing his task.  His attention, focus and multitasking is okay.  He sleeps good.  He has upcoming appointment to see his endocrinologist.  He has no tremors, shakes or any EPS.  He did go to [place] with his mother few weeks ago.  So far he feels the current medicine is working and does not want to change the dose.  Denies any panic attack, suicidal thoughts.  His appetite is okay.  His weight is stable.  Past Psychiatric History: Reviewed. H/O  ADHD.  In the past year tried Adderall.  He is taking Concerta and Zoloft since 2006.    Psychiatric Specialty Exam: Physical Exam  Review of Systems  Weight 165 lb (74.8 kg).There is no height or weight on file to calculate BMI.  General Appearance: NA  Eye Contact:  NA  Speech:  Slow  Volume:  Normal  Mood:  Euthymic  Affect:  NA  Thought Process:  Goal Directed  Orientation:  Full (Time, Place, and Person)  Thought Content:  WDL  Suicidal Thoughts:  No  Homicidal Thoughts:  No  Memory:  Immediate;   Good Recent;   Good Remote;   Good  Judgement:  Intact  Insight:  Present  Psychomotor Activity:  NA  Concentration:  Concentration: Fair and Attention Span: Fair  Recall:  Fair   Fund of Knowledge:  Good  Language:  Good  Akathisia:  No  Handed:  Right  AIMS (if indicated):     Assets:  Communication Skills Desire for Improvement Housing Resilience Social Support Talents/Skills Transportation  ADL's:  Intact  Cognition:  WNL  Sleep:   ok      Assessment and Plan: Generalized anxiety disorder.  Attention deficit disorder, inattentive type.  Patient like to keep his current medication as he is stable on current dose.  I will continue Zoloft 50 mg daily and Concerta 36 mg daily.  Recommended to call us back if is any question or any concern.  Follow-up in 3 months.  Follow Up Instructions:    I discussed the assessment and treatment plan with the patient. The patient was provided an opportunity to ask questions and all were answered. The patient agreed with the plan and demonstrated an understanding of the instructions.   The patient was advised to call back or seek an in-person evaluation if the symptoms worsen or if the condition fails to improve as anticipated.  I provided 12 minutes of non-face-to-face time during this encounter.   Cleotis Nipper, MD

## 2021-08-06 ENCOUNTER — Other Ambulatory Visit: Payer: Self-pay

## 2021-08-06 ENCOUNTER — Ambulatory Visit (INDEPENDENT_AMBULATORY_CARE_PROVIDER_SITE_OTHER): Payer: BC Managed Care – PPO | Admitting: Family Medicine

## 2021-08-06 DIAGNOSIS — E1065 Type 1 diabetes mellitus with hyperglycemia: Secondary | ICD-10-CM

## 2021-08-06 NOTE — Patient Instructions (Addendum)
  Diet Recommendations for Diabetes  Carbohydrate includes starch, sugar, and fiber.  Of these, only sugar and starch raise blood glucose.  (Fiber is found only in plant foods - in fruits, vegetables [especially skin, seeds, and stalks], whole grains, and beans.)   Starchy (carb) foods: Bread, rice, pasta, potatoes, corn, cereal, grits, crackers, bagels, muffins, all baked goods.  Fruit, milk, and yogurt also have carbohydrate as well.  Use small portions of bananas (limit to 1/2 at a time), grapes, watermelon, and oranges, if you eat them.   Protein foods: Meat, fish, poultry, eggs, dairy foods, and beans such as pinto and kidney beans (beans also provide carbohydrate).  Fat: butter, margarine, most meats, all fried foods, oils, nuts, seeds, nut butters, avocado, whole milk products, mayonnaise.  It is best to limit overall fat, and to get most of your fat from unsaturated fat sources such as olive or canola oil, nuts, seeds, nut butters.    Specific Recommendations:  1. Eat at least 3 REAL meals and 1-2 snacks per day. Eat breakfast within the first hour of getting up.  Have something to eat at least every 5 hours while awake.   A REAL meal includes at least a source of protein, some carb, and vegetables and/or fruit.  (It's ok if breakfast is just protein + carb.)  For now, limit your Cheerios to two cups (measure!) at breakfast.   If you can exercise after a meal, that will help blunt the rise in blood sugar, i.e., ride your stationary bike while watching TV after dinner.   2. Limit starchy foods to TWO per meal and ONE per snack. ONE portion of a starchy food is equal to the following:   - ONE slice of bread (or its equivalent, such as half of a hamburger bun).   - 1/2 cup of a "scoopable" starchy food such as potatoes or rice.   - 15 grams of Total Carbohydrate as shown on food label.  NOTE: FOR THE NEXT COUPLE OF WEEKS, PAY CLOSE ATTENTION TO YOUR BLOOD SUGAR LEVELS.  IF IT IS LOW BEFORE A  MEAL, BE SURE TO LOOK AT YOUR CHART IN THE KITCHEN TO ADJUST YOUR INSULIN DOSAGE.  If you need to adjust your insulin frequently, check in with Dr. Talmage Nap.   3. Include vegetables at BOTH lunch and dinner as often as you can.   - Fresh or frozen vegetables are best.   - Keep frozen vegetables on hand for a quick option.     - Consider some "grab-and-go" fresh vegetables you can pack in your lunch.    - Salads and soups are also good ways to work in vegetables.

## 2021-08-06 NOTE — Progress Notes (Signed)
Medical Nutrition Therapy PCP None currently Alexander Gavel, MD retired)  Endocrinologist Alexander Frames, MD  Alexander Wilkerson Alexander Wilkerson Patient has completed 2nd dose of COVID-19 vaccine on 02/29/20. Appt start time: 1600 end time: 1700 (1 hour) Primary concerns today: Blood sugar control.  Relevant history/background: Alexander Wilkerson was referred by Alexander Frames, MD for MNT related to Type 1 diabetes.  He was diagnosed with DM1 ~10 years ago, but hasn't seen an RD for a while.  Wants help in decreasing dietary carbohydrate and portion cntrl.  A1c was 9.5 on 05/20/21.  Other diagnoses include hypothyroidism, low HDL (39 on 05/20/21) and elevated TG (197 on 05/20/21).  Instructions provided by Dr. Talmage Wilkerson on 05/27/21 include:  Reduce saturated & trans fats, increase omega-3 fat sources and soluble fiber; exercise regularly; and limit alcohol intake Alexander Wilkerson does not drink alcohol).        Assessment:  Alexander Wilkerson lives alone, although he was accompanied at today's appt by his Alexander Wilkerson, Alexander Wilkerson.  He does most of his own food prep, with restaurant/takeout food ~1 X wk.  He works with Alexander Wilkerson [employer] Apr 1 through early December, usually 40 hrs/wk.  Packs a lunch on weekdays.   Current insulin regimen: 35 u novolog before breakfast, 40 u before dinner.   Alexander Wilkerson's Alexander Wilkerson describes him as a picky eater, and he acknowledges he likes routine with respect to meals/food choices.   Learning Readiness: Ready; Alexander Wilkerson has tried to help him lower CHO, but he is looking for some additional help.    Usual eating pattern: 3 meals and 2 snacks per day. Frequent foods and beverages: diet sodas, Fairlife milk, flavored water, unsweet tea ~ 2 X wk; bkfst of Cheerios w/ milk, chx bacon, [place] sandwich and baked chips, apple, sugar-free cookies for lunch, chx.   Avoided foods: cheese, yogurt, fish, seafood, legumes, most beef, onions, eggs.   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  Has an exercise bike at  home.   Sleep: Bedtime ~ 11 or 11:30, up ~6:30 AM.  Sleeps well.   FBG have been running 150s to 160s, with a low of 99 and a high of 171.  24-hr recall: (Up at 6:30 AM) B (7:15 AM)-   3 c Cheerios w/ 1+ c Fairlife (low-carb) fat-free milk, 5 chx bacon Snk (10 AM)-   1 apple, diet soda L (12 PM)-  1 [place] sandwich, baked chips, Voortman's sug-free cookies, diet soda Snk ( PM)-  Diet soda - (BG 178 pre-dinner) -  D (8:15 PM)-  3-4 oz ham, sauteed zucchini, fried okra, 1 c carb-smart ice cream, diet soda Snk ( PM)-  --- Typical day? Yes.    Nutritional Diagnosis:  NI-5.8.2 Excessive carbohydrate intake As related to meal components.  As evidenced by usual lunch consisting of at least 75 g or carbohydrate (including "sugar-free" cookies that provide 31 g per 10 cookies).  Intervention: Described what A1c is, and consequences of poorly controlled BG.  Recommended limiting carb intake, and described how to determine appropriate amount for meals/snacks.  Also emphasized importance of vegetables to overall health and for DM, especially.  Congratulated patient on his good BG record keeping and physical activity with his work, and suggested that even more activity would be helpful to  BG control.    Handouts given during visit include: After-Visit Summary (AVS)  Demonstrated degree of understanding via:  Teach Back  Barriers to learning/adherence to lifestyle change: Lives alone, limited culinary skills.  Monitoring/Evaluation:  Dietary intake, exercise, FBG, and body weight in 4 week(s).

## 2021-08-19 DIAGNOSIS — E02 Subclinical iodine-deficiency hypothyroidism: Secondary | ICD-10-CM | POA: Diagnosis not present

## 2021-08-19 DIAGNOSIS — E1065 Type 1 diabetes mellitus with hyperglycemia: Secondary | ICD-10-CM | POA: Diagnosis not present

## 2021-08-19 DIAGNOSIS — E781 Pure hyperglyceridemia: Secondary | ICD-10-CM | POA: Diagnosis not present

## 2021-08-26 DIAGNOSIS — E781 Pure hyperglyceridemia: Secondary | ICD-10-CM | POA: Diagnosis not present

## 2021-08-26 DIAGNOSIS — E02 Subclinical iodine-deficiency hypothyroidism: Secondary | ICD-10-CM | POA: Diagnosis not present

## 2021-08-26 DIAGNOSIS — E1065 Type 1 diabetes mellitus with hyperglycemia: Secondary | ICD-10-CM | POA: Diagnosis not present

## 2021-09-08 ENCOUNTER — Ambulatory Visit (INDEPENDENT_AMBULATORY_CARE_PROVIDER_SITE_OTHER): Payer: BC Managed Care – PPO | Admitting: Family Medicine

## 2021-09-08 ENCOUNTER — Other Ambulatory Visit: Payer: Self-pay

## 2021-09-08 ENCOUNTER — Encounter: Payer: Self-pay | Admitting: Family Medicine

## 2021-09-08 DIAGNOSIS — E1065 Type 1 diabetes mellitus with hyperglycemia: Secondary | ICD-10-CM | POA: Diagnosis not present

## 2021-09-08 NOTE — Patient Instructions (Addendum)
Reminder:  Once you stop working for the winter, it'll be good to start using your exercise bike at home, which you can do while watching TV.  During the daylight hours, you can also walk outside.  Exercise right after a meal is a great help in controlling your blood sugar.   Any time you see a fasting blood sugar that is especially high or especially low, immediately write down what you ate last, how much you ate, and what time it was.    Controlling your carb intake:  Read food labels for both carb content and portion size.   For example, the [employer] Potatoes provide 20 grams of carb for each 1/2 cup serving.  Make sure you are measuring the amount you use, so you can control the amount of carb you're eating.  The same thing applies to your CarbSmart ice cream: Measure out a portion that is shown on the label to be one serving size.  (You may want to get a glass - see-through - pyrex measuring cup.) Memorize what one carb portion is.               - ONE slice of bread (or its equivalent, such as half of a hamburger bun).              - 1/2 cup of a "scoopable" starchy food such as potatoes or rice.              - 15 grams of Total Carbohydrate as shown on food label.   Continue with the same goals:  1. Eat at least 3 REAL meals and 1-2 snacks per day. Eat breakfast within the first hour of getting up.  Have something to eat at least every 5 hours while awake.   A REAL meal includes at least a source of protein, some carb, and vegetables and/or fruit.  (It's ok if breakfast is just protein + carb.)   2. Limit starchy foods to TWO per meal and ONE per snack. ONE portion of a starchy food is equal to the following:              - ONE slice of bread (or its equivalent, such as half of a hamburger bun).              - 1/2 cup of a "scoopable" starchy food such as potatoes or rice.              - 15 grams of Total Carbohydrate as shown on food label.   3. Include vegetables at BOTH lunch and  dinner as often as you can.              - Fresh or frozen vegetables are best.              - Keep frozen vegetables on hand for a quick option.                - Consider some "grab-and-go" fresh vegetables you can pack in your lunch.               - Salads and soups are also good ways to work in vegetables.    Follow-up: In-office appointment on Monday, December 12 at 4 PM.  Call for an earlier appt, if you prefer:  [phone].

## 2021-09-08 NOTE — Progress Notes (Addendum)
Medical Nutrition Therapy PCP None currently [name], MD retired)  Endocrinologist Dorisann Frames, MD  Mom Georgetta Haber Patient has completed 2nd dose of COVID-19 vaccine on 02/29/20. Appt start time: 1600 end time: 1700 (1 hour) Primary concerns today: Blood sugar control.  Relevant history/background: Benzion was referred by Dorisann Frames, MD for MNT related to Type 1 diabetes.  He was diagnosed with DM1 ~10 years ago, but hasn't seen an RD for a while.  Wants help in decreasing dietary carbohydrate and portion cntrl.  A1c was 9.5 on 05/20/21.  Other diagnoses include hypothyroidism, low HDL (39 on 05/20/21) and elevated TG (197 on 05/20/21).  Instructions provided by Dr. Talmage Nap on 05/27/21 include:  Reduce saturated & trans fats, increase omega-3 fat sources and soluble fiber; exercise regularly; and limit alcohol intake [name] does not drink alcohol).        Assessment:  Monica has been eating more vegetables, and has mostly eliminated salty (high-carb) snack foods.  He got a very good report recently from Dr. Talmage Nap, showing an 08/19/21 A1c of 7.6 and lower TG.   Most recent instructions from Dr. Talmage Nap: If dinnertime BG is <120, he is to 30 U instead of 40 (at least 4 X in past 8 days).    Recent eating pattern: 3 meals and 2 snacks per day. 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  (Has not started using exercise bike at home.)    FBG have been running 130-140s,with a low of 95 and a high of 155 in the past 8 days.  24-hr recall:  (Up at 7 AM) B (7:15 AM)-  2 c Cheerios w/ 1+ c Fairlife (low-carb) fat-free milk, 5 slc chx bacon Snk (10 AM)-  1 apple, diet soda L (12 PM)-  1 [place] sandwich, 1 small cucumber, carrots, 1 Fiber One brownie, diet soda Snk ( PM)-  12 oz diet soda D ( PM)-  1 1/4 c rotisserie chx, 2 c broccoli, ~2 c microwavable mashed potatoes, 1 tbsp lite fake butter, diet soda Snk ( PM)-  1 c CarbSmart ice cream (16 g CHO) Typical day? Yes.     Nutritional Diagnosis: Progress noted on NI-5.8.2 Excessive carbohydrate intake as related to meal components as evidenced by most meals within recommended carb content (<2 carb exchanges or 30 g from carb foods).    Intervention: Reviewed diet and exercise history, and emphasized need for attn to carb content of foods.  Encouraged measuring carb foods for certainty.  Congratulated patient on his good choices.    Handouts given during visit include: After-Visit Summary (AVS)  Demonstrated degree of understanding via:  Teach Back  Barriers to learning/adherence to lifestyle change: Lives alone, limited culinary skills.    Monitoring/Evaluation:  Dietary intake, exercise, FBG, and body weight in 4 week(s).

## 2021-09-17 ENCOUNTER — Telehealth (HOSPITAL_COMMUNITY): Payer: Self-pay

## 2021-09-17 ENCOUNTER — Other Ambulatory Visit (HOSPITAL_COMMUNITY): Payer: Self-pay | Admitting: Psychiatry

## 2021-09-17 DIAGNOSIS — F900 Attention-deficit hyperactivity disorder, predominantly inattentive type: Secondary | ICD-10-CM

## 2021-09-17 MED ORDER — METHYLPHENIDATE HCL ER (OSM) 36 MG PO TBCR: 36.0000 mg | EXTENDED_RELEASE_TABLET | ORAL | 0 refills | Status: DC

## 2021-09-17 NOTE — Telephone Encounter (Signed)
This is Dr. Sheela Stack patient. Patient called requesting a refill on his Methylphenidate 36mg  to be sent to [facility] on [address] His next followup appointment isn't scheduled until 11/9. Please review and advise. Thank you

## 2021-09-17 NOTE — Telephone Encounter (Signed)
30 days sent

## 2021-09-29 NOTE — Telephone Encounter (Signed)
error 

## 2021-10-19 ENCOUNTER — Telehealth (HOSPITAL_COMMUNITY): Payer: Self-pay | Admitting: *Deleted

## 2021-10-19 DIAGNOSIS — F900 Attention-deficit hyperactivity disorder, predominantly inattentive type: Secondary | ICD-10-CM

## 2021-10-19 MED ORDER — METHYLPHENIDATE HCL ER (OSM) 36 MG PO TBCR: 36.0000 mg | EXTENDED_RELEASE_TABLET | ORAL | 0 refills | Status: DC

## 2021-10-19 NOTE — Telephone Encounter (Signed)
Pt called requesting a refill of the Concerta 36 mg. Pt has an upcoming appointment on 10/28/21. Please review.

## 2021-10-19 NOTE — Telephone Encounter (Signed)
Done

## 2021-10-28 ENCOUNTER — Telehealth (HOSPITAL_BASED_OUTPATIENT_CLINIC_OR_DEPARTMENT_OTHER): Payer: BC Managed Care – PPO | Admitting: Psychiatry

## 2021-10-28 ENCOUNTER — Other Ambulatory Visit (HOSPITAL_COMMUNITY): Payer: Self-pay | Admitting: Psychiatry

## 2021-10-28 ENCOUNTER — Other Ambulatory Visit: Payer: Self-pay

## 2021-10-28 ENCOUNTER — Encounter (HOSPITAL_COMMUNITY): Payer: Self-pay | Admitting: Psychiatry

## 2021-10-28 DIAGNOSIS — F900 Attention-deficit hyperactivity disorder, predominantly inattentive type: Secondary | ICD-10-CM | POA: Diagnosis not present

## 2021-10-28 DIAGNOSIS — F411 Generalized anxiety disorder: Secondary | ICD-10-CM

## 2021-10-28 MED ORDER — SERTRALINE HCL 50 MG PO TABS: 50.0000 mg | ORAL_TABLET | Freq: Every day | ORAL | 2 refills | Status: DC

## 2021-10-28 NOTE — Progress Notes (Signed)
Virtual Visit via Telephone Note  I connected with Arvilla Market on 10/28/21 at  4:00 PM EST by telephone and verified that I am speaking with the correct person using two identifiers.  Location: Patient: Home  Provider: Home Office   I discussed the limitations, risks, security and privacy concerns of performing an evaluation and management service by telephone and the availability of in person appointments. I also discussed with the patient that there may be a patient responsible charge related to this service. The patient expressed understanding and agreed to proceed.   History of Present Illness: Patient is evaluated by phone session.  He has not picked up his Concerta from the pharmacy because they are backorder.  He noticed for past few days some concern about his attention and concentration but otherwise symptoms are manageable.  His job is going well.  He denies any crying spells or any feeling of hopelessness.  He reported his depression is a stable and sleeping good.  He had a visit with his endocrinologist and Dr. August and his hemoglobin A1c was 7.2.  He has no tremors or shakes or any EPS.  His appetite is okay.  His weight is stable.  He denies any panic attack, nervousness or any feeling of hopelessness.  Past Psychiatric History: Reviewed. H/O  ADHD.  In the past year tried Adderall.  He is taking Concerta and Zoloft since 2006.  Psychiatric Specialty Exam: Physical Exam  Review of Systems  Weight 167 lb (75.8 kg).There is no height or weight on file to calculate BMI.  General Appearance: NA  Eye Contact:  NA  Speech:  Slow  Volume:  Normal  Mood:  Anxious  Affect:  Congruent  Thought Process:  Goal Directed  Orientation:  Full (Time, Place, and Person)  Thought Content:  Logical  Suicidal Thoughts:  No  Homicidal Thoughts:  No  Memory:  Immediate;   Good Recent;   Good Remote;   Good  Judgement:  Intact  Insight:  Present  Psychomotor Activity:  NA   Concentration:  Concentration: Fair and Attention Span: Fair  Recall:  Good  Fund of Knowledge:  Good  Language:  Good  Akathisia:  No  Handed:  Right  AIMS (if indicated):     Assets:  Communication Skills Desire for Improvement Housing Resilience Social Support Talents/Skills  ADL's:  Intact  Cognition:  WNL  Sleep:   ok      Assessment and Plan: Generalized anxiety disorder.  Attention deficit disorder, inattentive type.  Patient is having difficulty getting his Concerta as pharmacy has back order.  I offered to try a different medication but patient reluctant to change since he is comfortable with Concerta.  I recommend he should look for another pharmacy if they have it and then call us back.  Patient promised to give [place] a call if he continues to have difficulty getting Concerta.  Continue Zoloft 50 mg daily.  Recommended to call us back if there is any question or any concern.  Follow-up in 3 months.  Follow Up Instructions:    I discussed the assessment and treatment plan with the patient. The patient was provided an opportunity to ask questions and all were answered. The patient agreed with the plan and demonstrated an understanding of the instructions.   The patient was advised to call back or seek an in-person evaluation if the symptoms worsen or if the condition fails to improve as anticipated.  I provided 15 minutes of non-face-to-face  time during this encounter.   [name], MD

## 2021-11-30 ENCOUNTER — Ambulatory Visit (INDEPENDENT_AMBULATORY_CARE_PROVIDER_SITE_OTHER): Payer: BC Managed Care – PPO | Admitting: Family Medicine

## 2021-11-30 ENCOUNTER — Encounter: Payer: Self-pay | Admitting: Family Medicine

## 2021-11-30 ENCOUNTER — Other Ambulatory Visit: Payer: Self-pay

## 2021-11-30 DIAGNOSIS — E1065 Type 1 diabetes mellitus with hyperglycemia: Secondary | ICD-10-CM | POA: Diagnosis not present

## 2021-11-30 NOTE — Progress Notes (Signed)
Medical Nutrition Therapy PCP None currently [name], MD retired)  Endocrinologist Dorisann Frames, MD  Mom Georgetta Haber Patient has completed 2nd dose of COVID-19 vaccine on 02/29/20. Appt start time: 1600 end time: 1700 (1 hour) Primary concerns today: Blood sugar control.  Relevant history/background: [name] was referred by Dorisann Frames, MD for MNT related to Type 1 diabetes.  He was diagnosed with DM1 ~10 years ago, but hasn't seen an RD for a while.  Wants help in decreasing dietary carbohydrate and portion cntrl.  A1c was 9.5 on 05/20/21.  Other diagnoses include hypothyroidism, low HDL (39 on 05/20/21) and elevated TG (197 on 05/20/21).  Instructions provided by Dr. Talmage Nap on 05/27/21 include:  Reduce saturated & trans fats, increase omega-3 fat sources and soluble fiber; exercise regularly; and limit alcohol intake Alexander Wilkerson does not drink alcohol).        Assessment:  [name] was accompanied at today's appt by his mom Alexander Wilkerson.  His last work day for the season will be Dec 31.  He'll start back in April.  We discussed the need for making a conscious effort at getting exercise when he is not working Jan through March.  Also determined today that [name] is usually sleeping a lot later on weekends, suggesting insufficient sleep on weekdays.  He also tends to have late dinners, i.e., 8:30 PM, followed by even later bedtime snack, which may explain some of his elevated FBG. Gets up ~6:30 or 6:45 M-F, and gets to bed 11 or 11:30 PM.     Insulin (Novolog) dosing: 35 U in AM.  If dinnertime BG is <120, 30 U; if 120+, 40 U. Recent eating pattern: 3 meals and 2 snacks per day. 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  (Has still not started using exercise bike at home.)    FBG have been running 140-160s,with a low of 90 and a high of 167 in the past 2-3 weeks.  24-hr recall:  (Up at 9:30 AM) B (10 AM)-  2 c Cheerios, 1 c Fairlife f-f milk, 6 slc chx bacon,  Snk (  AM)-  --- L ( PM)-  ??? Snk ( PM)-  ??? D (7 PM)-  3 c chx-rice soup, 2 c broccoli, unsweet tea, 2 Splenda Snk (9 PM)-  1-2 c low-carb ice cream Typical day? Yes.  Typical of weekend day, but consistently has lunch on weekdays.    Nutritional Diagnosis: Stable progress on NI-5.8.2 Excessive carbohydrate intake as related to meal components as evidenced by patient attestation that he is limiting carbs at meals, and usually gets veg's 2 X day M-F.    Intervention: Reviewed diet and exercise history, and emphasized need for more sleep as well as exercise.    Handouts given during visit include: After-Visit Summary (AVS)  Barriers to learning/adherence to lifestyle change: Lives alone, limited culinary skills.    Monitoring/Evaluation:  Dietary intake, exercise, FBG, and body weight in 4 week(s).

## 2021-11-30 NOTE — Patient Instructions (Addendum)
Any time you see a fasting blood sugar that is especially high or especially low, immediately write down what you ate last, how much you ate, and what time it was.     Controlling your carb intake:  Read food labels for both carb content and portion size.   For example, the [employer] Potatoes provide 20 grams of carb for each 1/2 cup serving.  Make sure you are measuring the amount you use, so you can control the amount of carb you're eating.  The same thing applies to your CarbSmart ice cream: Measure out a portion that is shown on the label to be one serving size.  (You may want to get a glass - see-through - pyrex measuring cup.) Memorize what one carb portion is.               - ONE slice of bread (or its equivalent, such as half of a hamburger bun).              - 1/2 cup of a "scoopable" starchy food such as potatoes or rice.              - 15 grams of Total Carbohydrate as shown on food label.    Not getting enough sleep makes both weight and blood glucose management more difficult.  For this reason, it would be good if you can get more sleep.    Goals:  1. Go to bed no later than 10:30 PM, including on weekends.   2. Eat at least 3 REAL meals and 1-2 snacks per day. Eat breakfast within the first hour of getting up.  Have something to eat at least every 5 hours while awake.   A REAL meal includes at least a source of protein, some carb, and vegetables and/or fruit.  (It's ok if breakfast is just protein + carb.)  3. Limit starchy foods to TWO per meal and ONE per snack. ONE portion of a starchy food is equal to the following:              - ONE slice of bread (or its equivalent, such as half of a hamburger bun).              - 1/2 cup of a "scoopable" starchy food such as potatoes or rice.              - 15 grams of Total Carbohydrate as shown on food label.  4. Exercise (walk or stationary bike) at least 30 minutes 5 days a week.      Follow-up: In-office appointment on Thursday,  Feb 16 at 1:30 PM.

## 2022-01-27 ENCOUNTER — Telehealth (HOSPITAL_COMMUNITY): Payer: BC Managed Care – PPO | Admitting: Psychiatry

## 2022-01-29 ENCOUNTER — Other Ambulatory Visit (HOSPITAL_COMMUNITY): Payer: Self-pay | Admitting: Psychiatry

## 2022-01-29 DIAGNOSIS — F411 Generalized anxiety disorder: Secondary | ICD-10-CM

## 2022-02-03 ENCOUNTER — Encounter (HOSPITAL_COMMUNITY): Payer: Self-pay | Admitting: Psychiatry

## 2022-02-03 ENCOUNTER — Telehealth (HOSPITAL_BASED_OUTPATIENT_CLINIC_OR_DEPARTMENT_OTHER): Payer: BC Managed Care – PPO | Admitting: Psychiatry

## 2022-02-03 ENCOUNTER — Other Ambulatory Visit: Payer: Self-pay

## 2022-02-03 VITALS — Wt 170.0 lb

## 2022-02-03 DIAGNOSIS — F411 Generalized anxiety disorder: Secondary | ICD-10-CM | POA: Diagnosis not present

## 2022-02-03 DIAGNOSIS — F900 Attention-deficit hyperactivity disorder, predominantly inattentive type: Secondary | ICD-10-CM

## 2022-02-03 MED ORDER — SERTRALINE HCL 50 MG PO TABS: 50.0000 mg | ORAL_TABLET | Freq: Every day | ORAL | 2 refills | Status: DC

## 2022-02-03 NOTE — Progress Notes (Signed)
Virtual Visit via Telephone Note  I connected with Alexander Wilkerson on 02/03/22 at  3:20 PM EST by telephone and verified that I am speaking with the correct person using two identifiers.  Location: Patient: Home Provider: Home Office   I discussed the limitations, risks, security and privacy concerns of performing an evaluation and management service by telephone and the availability of in person appointments. I also discussed with the patient that there may be a patient responsible charge related to this service. The patient expressed understanding and agreed to proceed.   History of Present Illness: Patient is evaluated by phone session.  He is not taking Concerta for more than 3 months.  He has issues getting his prescription filled as pharmacy has back order.  He is not working but he will start working April 1.  He feels things are going okay and he does not have struggle with attention and focus however he may needed if he start to work and having issues then.  He is sleeping good.  He admitted to pound weight gain but denies any anxiety, depression, paranoia or any hallucination.  He has upcoming appointment with his endocrinologist in few weeks.  He denies any panic attack.  His appetite is okay and his energy is good.  Recently he had a trip to [place] with his parents.    Past Psychiatric History:  H/O  ADHD.  In the past year tried Adderall.  He is taking Concerta and Zoloft since 2006.  Psychiatric Specialty Exam: Physical Exam  Review of Systems  Weight 170 lb (77.1 kg).There is no height or weight on file to calculate BMI.  General Appearance: NA  Eye Contact:  NA  Speech:  Slow  Volume:  Normal  Mood:  Euthymic  Affect:  NA  Thought Process:  Goal Directed  Orientation:  Full (Time, Place, and Person)  Thought Content:  WDL  Suicidal Thoughts:  No  Homicidal Thoughts:  No  Memory:  Immediate;   Good Recent;   Good Remote;   Good  Judgement:  Intact  Insight:  Present   Psychomotor Activity:  NA  Concentration:  Concentration: Fair and Attention Span: Fair  Recall:  Good  Fund of Knowledge:  Good  Language:  Good  Akathisia:  No  Handed:  Right  AIMS (if indicated):     Assets:  Communication Skills Desire for Improvement Housing Social Support Transportation  ADL's:  Intact  Cognition:  WNL  Sleep:   ok      Assessment and Plan: Generalized anxiety disorder.  Attention deficit disorder, inattentive type.  Patient not taking Concerta for more than 3 months.  He does not feel a huge difference because he is not working but he will start again working April 1.  I recommended to call us if he struggle with attention and focus.  He agreed with the plan.  He like to keep the Zoloft 50 mg daily.  Recommended to call us back if is any question or any concern.  Follow-up in 3 months.  Follow Up Instructions:    I discussed the assessment and treatment plan with the patient. The patient was provided an opportunity to ask questions and all were answered. The patient agreed with the plan and demonstrated an understanding of the instructions.   The patient was advised to call back or seek an in-person evaluation if the symptoms worsen or if the condition fails to improve as anticipated.  I provided 19 minutes of  non-face-to-face time during this encounter.   [name], MD

## 2022-02-04 ENCOUNTER — Ambulatory Visit (INDEPENDENT_AMBULATORY_CARE_PROVIDER_SITE_OTHER): Payer: BC Managed Care – PPO | Admitting: Family Medicine

## 2022-02-04 ENCOUNTER — Other Ambulatory Visit: Payer: Self-pay

## 2022-02-04 DIAGNOSIS — E1065 Type 1 diabetes mellitus with hyperglycemia: Secondary | ICD-10-CM | POA: Diagnosis not present

## 2022-02-04 NOTE — Patient Instructions (Signed)
Controlling your carb intake:  Read food labels for both carb content and portion size.   For example, [employer]-fried chicken nuggets provides 15 grams of carb for 5 nuggets.  If you have 10 of these, that's your carb food for the meal.  Measure out your CarbSmart ice cream for a portion that is shown on the label to be one serving size.   Memorize what one carb portion is.               - ONE slice of bread (or its equivalent, such as half of a hamburger bun).              - 1/2 cup of a "scoopable" starchy food such as potatoes or rice.              - 15 grams of Total Carbohydrate as shown on food label.   Goals:  1. Go to bed no later than 11 PM including on weekends.  Move this bedtime up to 10:30 PM starting on March 12.   2. Eat at least 3 REAL meals and 1-2 snacks per day. Eat breakfast within the first hour of getting up.  Have something to eat at least every 5 hours while awake.   A REAL meal includes at least a source of protein, some carb, and vegetables and/or fruit.  (It's ok if breakfast is just protein + carb.)  3. Limit starchy foods to TWO per meal and ONE per snack. ONE portion of a starchy food is equal to the following:              - ONE slice of bread (or its equivalent, such as half of a hamburger bun).              - 1/2 cup of a "scoopable" starchy food such as potatoes or rice.              - 15 grams of Total Carbohydrate as shown on food label.  4. Exercise (walk or stationary bike) at least 30 minutes 5 days a week.      Follow-up: In-office appointment on Monday, April 17 at 4 PM.

## 2022-02-04 NOTE — Progress Notes (Signed)
Medical Nutrition Therapy PCP None currently Alexander Gavel, MD retired)  Endocrinologist Alexander Frames, MD  Mom Alexander Wilkerson Patient has completed 2nd dose of COVID-19 vaccine on 02/29/20. Appt start time: 1330 end time: 1430 (1 hour) Primary concerns today: Blood sugar control.  Relevant history/background: [name] was referred by Alexander Frames, MD for MNT related to Type 1 diabetes.  He was diagnosed with DM1 ~10 years ago, but hasn't seen an RD for a while.  Wants help in decreasing dietary carbohydrate and portion cntrl.  A1c was 9.5 on 05/20/21.  Other diagnoses include hypothyroidism, low HDL (39 on 05/20/21) and elevated TG (197 on 05/20/21).  Instructions provided by Dr. Talmage Nap on 05/27/21 include:  Reduce saturated & trans fats, increase omega-3 fat sources and soluble fiber; exercise regularly; and limit alcohol intake [name] does not drink alcohol).        Assessment:  [name] was accompanied at today's appt by his mom Alexander Wilkerson.  He has done well documenting his progress on goals.  Not meeting bedtime goal of 10:30 PM.  Will start back to work on April 1.   Insulin (Novolog) dosing remains the same: 35 U in AM.  If dinnertime BG is <120, 30 U; if 120+, 40 U. Recent eating pattern: 2-3 meals and 2 snacks per day. FBG have been running 130s to 150s,with a low of 107 and a high of 173 in the past 2-3 weeks.  FBG was 159 this AM following last night's dinner of ~5 carb portions.   Behavioral goals Meeting goals  3 meals/day:  3- 6 days/week 10:30 bedtime:  0-2 X wk <2 starches/meal:  3-5 days/week >30 min exercise:  7 days/week (walking or stat bike)  24-hr recall:  (Up at 8 AM) B (8:15 AM)-  2 c Cheerios, 1 c Fairlife fat-free milk, 6 slc bacon Snk ( AM)-  --- L (1 PM)-  2 oz [place], small cucumber, 1/2 c baby carrots, 1 c Skinny popcorn, 16 oz diet soda Snk ( PM)-  ?16 oz diet soda? D (7:30 PM)-  10 chx nuggets, 2 c green beans, 1 tsp marg, 1 large baked potato, 2-3 tsp  margarine, 16 oz diet soda Snk ( PM)-  1-1 1/2 c low-CHO ice cream Typical day? Yes.      Nutritional Diagnosis: No progress on NI-5.8.2 Excessive carbohydrate intake as related to meal components as evidenced by Goals Sheet documentation and food recall indicating several meals/week with excessive carb.    Intervention: Reviewed diet and exercise history, and emphasized need for more sleep as well as exercise.    Handouts given during visit include: After-Visit Summary (AVS)  Barriers to learning/adherence to lifestyle change: Lives alone, limited culinary skills.    Monitoring/Evaluation:  Dietary intake, exercise, FBG, and body weight in 8 week(s) (after [name] is back to work for a couple of weeks).

## 2022-02-17 DIAGNOSIS — E1065 Type 1 diabetes mellitus with hyperglycemia: Secondary | ICD-10-CM | POA: Diagnosis not present

## 2022-02-17 DIAGNOSIS — E02 Subclinical iodine-deficiency hypothyroidism: Secondary | ICD-10-CM | POA: Diagnosis not present

## 2022-02-17 DIAGNOSIS — E781 Pure hyperglyceridemia: Secondary | ICD-10-CM | POA: Diagnosis not present

## 2022-02-24 DIAGNOSIS — E1065 Type 1 diabetes mellitus with hyperglycemia: Secondary | ICD-10-CM | POA: Diagnosis not present

## 2022-02-24 DIAGNOSIS — E781 Pure hyperglyceridemia: Secondary | ICD-10-CM | POA: Diagnosis not present

## 2022-02-24 DIAGNOSIS — E02 Subclinical iodine-deficiency hypothyroidism: Secondary | ICD-10-CM | POA: Diagnosis not present

## 2022-03-08 ENCOUNTER — Ambulatory Visit: Payer: BC Managed Care – PPO | Admitting: Podiatry

## 2022-03-08 ENCOUNTER — Other Ambulatory Visit: Payer: Self-pay

## 2022-03-08 DIAGNOSIS — E109 Type 1 diabetes mellitus without complications: Secondary | ICD-10-CM

## 2022-03-08 DIAGNOSIS — E0843 Diabetes mellitus due to underlying condition with diabetic autonomic (poly)neuropathy: Secondary | ICD-10-CM

## 2022-03-08 NOTE — Progress Notes (Signed)
? ?  HPI: 31 y.o. male PMHx DM type I presenting today as a new patient with his mother, Ginger, for diabetic foot exam.  Patient states that he was sent by his endocrinologist.  They are requesting diabetic shoes and insoles.  He presents for further treatment and evaluation ? ?Past Medical History:  ?Diagnosis Date  ? Autism spectrum disorder   ? Diabetes mellitus, type II (HCC)   ? ? ?Past Surgical History:  ?Procedure Laterality Date  ? WISDOM TOOTH EXTRACTION    ? ? ?No Known Allergies ?  ?Physical Exam: ?General: The patient is alert and oriented x3 in no acute distress. ? ?Dermatology: Skin is warm, dry and supple bilateral lower extremities. Negative for open lesions or macerations.  There is some hyperkeratotic preulcerative callus tissue noted to the bilateral great toes ? ?Vascular: Palpable pedal pulses bilaterally. Capillary refill within normal limits.  Negative for any significant edema or erythema ? ?Neurological: Light touch and protective threshold grossly intact ? ?Musculoskeletal Exam: No pedal deformities noted ? ?Radiographic Exam:  ?Normal osseous mineralization. Joint spaces preserved. No fracture/dislocation/boney destruction.   ? ?Assessment: ?1.  Diabetes mellitus ?2.  Preulcerative callus lesions bilateral great toes ? ? ?Plan of Care:  ?1. Patient evaluated. ?2.  Comprehensive diabetic foot exam performed today ?3.  Appointment with Pedorthist for custom molded diabetic insoles and shoes ?4.  Return to clinic annually ?  ?  ?Felecia Shelling, DPM ?[facility] ? ?Dr. Felecia Shelling, DPM  ?  ?[address]                                        ?[place], [place] [address]                ?Office [phone]  ?Fax [phone] ? ? ? ? ?

## 2022-03-10 ENCOUNTER — Ambulatory Visit (INDEPENDENT_AMBULATORY_CARE_PROVIDER_SITE_OTHER): Payer: BC Managed Care – PPO

## 2022-03-10 ENCOUNTER — Other Ambulatory Visit: Payer: Self-pay

## 2022-03-10 DIAGNOSIS — L84 Corns and callosities: Secondary | ICD-10-CM | POA: Diagnosis not present

## 2022-03-10 DIAGNOSIS — E109 Type 1 diabetes mellitus without complications: Secondary | ICD-10-CM | POA: Diagnosis not present

## 2022-03-10 NOTE — Progress Notes (Signed)
SITUATION ?Reason for Consult: Evaluation for Prefabricated Diabetic Shoes and Custom Diabetic Inserts. ?Patient / Caregiver Report: Patient would like well fitting shoes ? ?OBJECTIVE DATA: ?Patient History / Diagnosis:  ?  ICD-10-CM   ?1. Encounter for comprehensive diabetic foot examination, type 1 diabetes mellitus (HCC)  E10.9   ?  ? ? ?Current or Previous Devices:   None and no history ? ?In-Person Foot Examination: ?Ulcers & Callousing:   Hallux bilateral ?Deformities:    None ?Sensation:    Intact  ?Shoe Size:     7XW ? ?ORTHOTIC RECOMMENDATION ?Recommended Devices: ?- 1x pair prefabricated PDAC approved diabetic shoes; Patient Selected [employer] [facility] 529 Size 8.36M per patient preference ?- 3x pair custom-to-patient PDAC approved vacuum formed diabetic insoles. ? ?GOALS OF SHOES AND INSOLES ?- Reduce shear and pressure ?- Reduce / Prevent callus formation ?- Reduce / Prevent ulceration ?- Protect the fragile healing compromised diabetic foot. ? ?Patient would benefit from diabetic shoes and inserts as patient has diabetes mellitus and the patient has one or more of the following conditions: ?- History of pre-ulcerative callus ?- Peripheral neuropathy with evidence of callus formation ?- Poor circulation ? ?ACTIONS PERFORMED ?Potential out of pocket cost was communicated to patient. Patient understood and consented to measurement and casting. Patient was casted for insoles via crush box and measured for shoes via brannock device. Procedure was explained and patient tolerated procedure well. All questions were answered and concerns addressed. Casts were shipped to central fabrication, shoes were ordered. ? ?PLAN ?Patient is to be contacted and scheduled for fitting once shoes and insoles have been fabricated and received. ? ?

## 2022-03-31 ENCOUNTER — Telehealth: Payer: Self-pay

## 2022-03-31 NOTE — Telephone Encounter (Signed)
Patient's diabetic shoes and insoles are ready - left message asking patient to return call and schedule appointment for pickup ?

## 2022-04-05 ENCOUNTER — Encounter: Payer: Self-pay | Admitting: Family Medicine

## 2022-04-05 ENCOUNTER — Ambulatory Visit (INDEPENDENT_AMBULATORY_CARE_PROVIDER_SITE_OTHER): Payer: BC Managed Care – PPO | Admitting: Family Medicine

## 2022-04-05 DIAGNOSIS — E1065 Type 1 diabetes mellitus with hyperglycemia: Secondary | ICD-10-CM | POA: Diagnosis not present

## 2022-04-05 NOTE — Progress Notes (Signed)
Medical Nutrition Therapy ?PCP None currently Alexander China, MD retired)  ?Endocrinologist Alexander Pi, MD  ?Mom Alexander Wilkerson ?Appt start time: 160 end time: 1700 (1 hour) ?Primary concerns today: Blood sugar control.  ?Relevant history/background: Alexander Wilkerson was referred by Alexander Pi, MD for MNT related to Type 1 diabetes.  He was diagnosed with DM1 ~10 years ago, but hasn't seen an RD for a while.  Wants help in decreasing dietary carbohydrate and portion cntrl.  A1c was 9.5 on 05/20/21.  Other diagnoses include hypothyroidism, low HDL (39 on 05/20/21) and elevated TG (197 on 05/20/21).  Instructions provided by Dr. Chalmers Cater on 05/27/21 include:  Reduce saturated & trans fats, increase omega-3 fat sources and soluble fiber; exercise regularly; and limit alcohol intake Alexander Wilkerson does not drink alcohol).       ? ?Assessment:  Alexander Wilkerson was accompanied at today's appt by his mom Alexander Wilkerson.  He started back to work on April 3, but was doing quite well on his physical activity goals even before.  Alexander Wilkerson has not changed bedtime, and was clearly tired in today's appt, yawning a number of times. Now getting up at 7:30 vs. 9 AM, while keeping bedtime the same as over the winter.  He has not paid attention to the carb content and portion size of his Carb-smart ice cream he has nightly.  I emphasized the importance of this again today. A1c was slightly higher on 03/07/22, 7.7%.   ?Insulin (Novolog) dosing remains the same: 35 U in AM.  If dinnertime BG is <120, 30 U; if 120+, 40 U. ?Weight has remained stable at 171 lb.  ?Recent eating pattern: 3 meals and 2 snacks per day. ?FBG have been running 140s to 150s,with a low of 94 and a high of 164 in the past 2-3 weeks.  FBG was 159 this AM following last night's dinner of ~5 carb portions.   ? ?Alexander Wilkerson has done a great job recording progress on goals as well as his BG levels.   ?Behavioral goals Meeting goals  ?3 meals/day:  daily ?10:30 bedtime:  0 X wk ?<2  starches/meal:  daily ?>30 min exercise:  5-7 days/week (walking or stat bike) ? ?24-hr recall:  ?(Up at 8 AM) ?B (9:30 AM)-  2 c Cheerios, 1 c Fairlife f-f milk, 5-6 chx bacon ?Snk ( AM)-  --- ?L (12:30 PM)-  Chx waffle, 1 tbsp butter, 4 slc bacon, unswt tea ?Snk ( PM)-  --- ?D (7 PM)-  1 1/2-2 c rotisserie chx, 2 c broccoli, 2 tbsp lite butter, 2 small swt potatoes, unswt tea ?Snk ( PM)-  1+ c Carb-smart ice cream ?Typical day? No. Went out to brunch Sunday.   ? ?Nutritional Diagnosis: Apparent progress on NI-5.8.2 Excessive carbohydrate intake as related to meal components as evidenced by Goals Sheet documentation and food recall adherence most days to goal of 2 starch portions per meal.   ? ?Intervention: Reviewed diet and exercise history, and again discussed need for more sleep.   ? ?Handouts given during visit include: ?After-Visit Summary (AVS) ? ?Barriers to learning/adherence to lifestyle change: Lives alone, limited culinary skills.   ? ?Monitoring/Evaluation:  Dietary intake, exercise, FBG, and body weight in 5 week(s). ? ? ?

## 2022-04-05 NOTE — Patient Instructions (Addendum)
A good goal for your fasting blood glucose: 100-120 ? ?Memorize what one carb portion is.   ?            - ONE slice of bread (or its equivalent, such as half of a hamburger bun).  ?            - 1/2 cup of a "scoopable" starchy food such as potatoes or rice.  ?            - 15 grams of Total Carbohydrate as shown on food label.  ?To use your Carb-Smart ice cream without causing too much rise in blood sugar, limit portion size to 2/3 cup, which means use the ramekin you have.   ?Another option is to make sure you ride the stationary bike or go for a walk whenever you have ice cream.   ?Experiment:   ?Have the same dinner, with same quantities at the same time multiple times.  Record what you have, how much, and what time, then have ice cream with or without exercise, and record the minutes of exercise you do (walking or biking).  Check fasting blood glucose at the same time next morning, and record this number.   ?- Recommendation: Eat dinner no later than 6:30, and have your ice cream no later than 7:30 PM.   ? ?Being physically active the day before can really help keep your fasting glucose level lower.  Even a few minutes of physical activity right after a meal helps to drop the blood sugar level.   ? ?Goals:  ?1. Go to bed no later than 11 PM including on weekends.  Move this bedtime up to 10:30 PM by 4 weeks out.   ?2. Limit starchy foods to TWO per meal and ONE per snack. ONE portion of a starchy food is equal to the following:  ?            - ONE slice of bread (or its equivalent, such as half of a hamburger bun).  ?            - 1/2 cup of a "scoopable" starchy food such as potatoes or rice.  ?            - 15 grams of Total Carbohydrate as shown on food label.  ?3. Exercise (walk or stationary bike) at least 30 minutes 5 days a week.    ? ?Document on your Goals Sheet what time you went to bed each night, and write the actual number of minutes you exercise.   ? ?Follow-up: In-office appointment on Thursday,  May 25 at 4 PM. ?  ?

## 2022-05-04 ENCOUNTER — Other Ambulatory Visit (HOSPITAL_COMMUNITY): Payer: Self-pay | Admitting: *Deleted

## 2022-05-04 DIAGNOSIS — F900 Attention-deficit hyperactivity disorder, predominantly inattentive type: Secondary | ICD-10-CM

## 2022-05-04 DIAGNOSIS — F411 Generalized anxiety disorder: Secondary | ICD-10-CM

## 2022-05-04 MED ORDER — SERTRALINE HCL 50 MG PO TABS: 50.0000 mg | ORAL_TABLET | Freq: Every day | ORAL | 0 refills | Status: DC

## 2022-05-05 ENCOUNTER — Telehealth (HOSPITAL_COMMUNITY): Payer: BC Managed Care – PPO | Admitting: Psychiatry

## 2022-05-13 ENCOUNTER — Encounter: Payer: Self-pay | Admitting: Family Medicine

## 2022-05-13 ENCOUNTER — Ambulatory Visit (INDEPENDENT_AMBULATORY_CARE_PROVIDER_SITE_OTHER): Payer: BC Managed Care – PPO | Admitting: Family Medicine

## 2022-05-13 DIAGNOSIS — E1065 Type 1 diabetes mellitus with hyperglycemia: Secondary | ICD-10-CM | POA: Diagnosis not present

## 2022-05-13 NOTE — Patient Instructions (Signed)
ANY time you eat a food that contains carbohydrate (whether from sugar or from starch), make sure you read the total carb content, and figure out how much you are getting in the portion size you are eating.    Reading a food label for carbohydrate content:  Example is Voortmans Sugar-free mini iced oatmeal cookies:  Serving size = 6 cookies Total calories = 120  Total carbohydrate = 19 grams Dietary fiber = 1 gram Total sugar = 0 grams Added sugar = 0 grams Sugar alcohol = 9 grams  What you need to know:  A type of carbohydrate not required to be reported on the label is STARCH.  In the above example, starch is what makes up the difference between total carb listed (19 grams) and the carb breakdown of fiber (1 gram) and sugar alcohols (9 grams).  When you are looking how much carb is in a serving, the main number you need to pay attention to is Total Carbohydrate.  If the food has any sugar alcohol, you can assume the sugar equivalent is about half the grams of sugar alcohol listed, and you can subtract number from the total.     Memorize what one carb portion is.               - ONE slice of bread (or its equivalent, such as half of a hamburger bun).              - 1/2 cup of a "scoopable" starchy food such as potatoes or rice.              - 15 grams of Total Carbohydrate as shown on food label.  To use your Carb-Smart ice cream without causing too much rise in blood sugar, limit portion size to 2/3 cup, which means use the ramekin you have.  (Get some more ramekins from your mom!)  Total carb of 2/3 cup = 16 grams.   Another option is to make sure you ride the stationary bike or go for a walk whenever you have ice cream.   Without knowing it, you experimented last night:   You mowed the neighbor's yard after work last night, then had a blood sugar level of 98 before dinner.  You ate plenty of carb for dinner, but still had only a fasting blood sugar of 99 this morning.  The same pattern showed  up in your fasting blood sugar on May 15, which followed a day when you used the stationary bike the afternoon before.   - Recommendation: Eat dinner no later than 6:30, and have your ice cream no later than 7:30 PM.     Being physically active the day before can really help keep your fasting glucose level lower.  Even a few minutes of physical activity right after a meal helps to drop the blood sugar level.     Goals:  1. Go to bed no later than 11 PM including on weekends.      Set a 10:45 PM alarm on your phone as a reminder to get ready for bed.   2. Limit starchy foods to TWO per meal and ONE per snack. ONE portion of a starchy food is equal to the following:              - ONE slice of bread (or its equivalent, such as half of a hamburger bun).              - 1/2  cup of a "scoopable" starchy food such as potatoes or rice.              - 15 grams of Total Carbohydrate as shown on food label.  3. Exercise (walk or stationary bike) at least 30 minutes 5 days a week.      Document on your Goals Sheet what time you went to bed each night, and write the actual number of minutes you exercise.     Follow-up: In-office appointment on Tuesday, June 27 at 4 PM.

## 2022-05-13 NOTE — Progress Notes (Signed)
Medical Nutrition Therapy PCP None currently [name], MD retired)  Endocrinologist Jacelyn Pi, MD  [place] Appt start time: 160 end time: 1700 (1 hour) Primary concerns today: Blood sugar control.  Relevant history/background: Alexander Wilkerson was referred by Jacelyn Pi, MD for MNT related to Type 1 diabetes.  He was diagnosed with DM1 ~10 years ago, but hasn't seen an RD for a while.  Wants help in decreasing dietary carbohydrate and portion cntrl.  A1c was 9.5 on 05/20/21.  Other diagnoses include hypothyroidism, low HDL (39 on 05/20/21) and elevated TG (197 on 05/20/21).  Instructions provided by Dr. Chalmers Cater on 05/27/21 include:  Reduce saturated & trans fats, increase omega-3 fat sources and soluble fiber; exercise regularly; and limit alcohol intake [name] does not drink alcohol).        Assessment:  [name] started off meeting his bedtime goal and exercise goals most days the first couple of weeks after last MNT appt.  Both behaviors have fallen off, however.  In addition, he is not calculating carb content of foods at many meals.  Has not yet obtained the small ramekins for his nightly ice cream as discussed at last appt.  (No ice cream last night b/c he is out of it.) A1c was 7.7% on 03/07/22.  Will likely get next A1c in early July.    Insulin (Novolog) dosing remains the same: 35 U in AM.  If dinnertime BG is <120, 30 U; if 120+, 40 U. Weight 173.2 lb.  (171.6 on 04/05/22; ht is 64".) Recent eating pattern: 3 meals and 2 snacks per day. FBG have been running 140s to 160s,with 3 readings 99-102 in the past 4 weeks.  FBG was 99 this AM, which  following mowing the neighbor's lawn last night after work (30-40 min) and a dinner that included a moderate amt of carb.    [name] has done a great job recording progress on goals as well as his BG levels.   Progress the past 3 weeks: Behavioral goals Meeting goals  11 PM bedtime:  0 X wk <2 starches/meal:  Most days >30 min exercise:  1-2  days/week (stat bike or mowing grass)  24-hr recall:  (Up at 6:50 AM) B (7 AM)-  2 c Cheerios, 1 c Fairlife fat-free milk, 5 slc chx bacon Snk (10 AM)-  1 apple, diet soda  L (12 PM)-  1 turk sandw, 1 small cucumber, 3/4 c baby carrots, handful chips, diet soda Snk (3 PM)-  12 oz diet soda D (7:30 PM)-  1 c rotisserie chx, 2 c (frozen) fried okra, 5-6 small Voortman sugar-free cookies (~15 g carb), diet soda Snk ( PM)-  --- Typical day? Yes.    Nutritional Diagnosis: Limited progress on NI-5.8.2 Excessive carbohydrate intake as related to meal components as evidenced by patient exceeding carb recommendations per meal, still not remembering to check carb content of many foods.    Intervention: Reviewed diet and exercise history, and again discussed need for more sleep.    Handouts given during visit include: After-Visit Summary (AVS)  Barriers to learning/adherence to lifestyle change: Lives alone, limited culinary skills.    Monitoring/Evaluation:  Dietary intake, exercise, FBG, and body weight in 5 week(s).

## 2022-05-24 ENCOUNTER — Telehealth: Payer: Self-pay

## 2022-05-24 NOTE — Telephone Encounter (Signed)
LVM for patient to pick up shoes

## 2022-05-27 ENCOUNTER — Ambulatory Visit: Payer: BC Managed Care – PPO

## 2022-05-27 DIAGNOSIS — E109 Type 1 diabetes mellitus without complications: Secondary | ICD-10-CM

## 2022-05-27 NOTE — Progress Notes (Signed)
SITUATION Reason for Visit: Fitting of Diabetic Shoes & Insoles Patient / Caregiver Report:  Patient is satisfied with fit and function of shoes and insoles.  OBJECTIVE DATA: Patient History / Diagnosis:     ICD-10-CM   1. Encounter for comprehensive diabetic foot examination, type 1 diabetes mellitus (HCC)  E10.9       Change in Status:   None  ACTIONS PERFORMED: In-Person Delivery, patient was fit with: - 1x pair [id number] PDAC approved prefabricated Diabetic Shoes: [employer] [place] 521 8.70M - 3x pair [phone] PDAC approved vacuum formed custom diabetic insoles; [employer]: [id number]  Shoes and insoles were verified for structural integrity and safety. Patient wore shoes and insoles in office. Skin was inspected and free of areas of concern after wearing shoes and inserts. Shoes and inserts fit properly. Patient / Caregiver provided with ferbal instruction and demonstration regarding donning, doffing, wear, care, proper fit, function, purpose, cleaning, and use of shoes and insoles ' and in all related precautions and risks and benefits regarding shoes and insoles. Patient / Caregiver was instructed to wear properly fitting socks with shoes at all times. Patient was also provided with verbal instruction regarding how to report any failures or malfunctions of shoes or inserts, and necessary follow up care. Patient / Caregiver was also instructed to contact physician regarding change in status that may affect function of shoes and inserts.   Patient / Caregiver verbalized undersatnding of instruction provided. Patient / Caregiver demonstrated independence with proper donning and doffing of shoes and inserts.  PLAN Patient to follow with treating physician as recommended. Plan of care was discussed with and agreed upon by patient and/or caregiver. All questions were answered and concerns addressed.

## 2022-06-02 ENCOUNTER — Encounter (HOSPITAL_COMMUNITY): Payer: Self-pay | Admitting: Psychiatry

## 2022-06-02 ENCOUNTER — Telehealth (HOSPITAL_BASED_OUTPATIENT_CLINIC_OR_DEPARTMENT_OTHER): Payer: BC Managed Care – PPO | Admitting: Psychiatry

## 2022-06-02 DIAGNOSIS — F411 Generalized anxiety disorder: Secondary | ICD-10-CM

## 2022-06-02 DIAGNOSIS — F900 Attention-deficit hyperactivity disorder, predominantly inattentive type: Secondary | ICD-10-CM

## 2022-06-02 MED ORDER — SERTRALINE HCL 50 MG PO TABS: 50.0000 mg | ORAL_TABLET | Freq: Every day | ORAL | 0 refills | Status: DC

## 2022-06-02 NOTE — Progress Notes (Signed)
Virtual Visit via Telephone Note  I connected with Alexander Wilkerson on 06/02/22 at  3:20 PM EDT by telephone and verified that I am speaking with the correct person using two identifiers.  Location: Patient: Home Provider: Home Office   I discussed the limitations, risks, security and privacy concerns of performing an evaluation and management service by telephone and the availability of in person appointments. I also discussed with the patient that there may be a patient responsible charge related to this service. The patient expressed understanding and agreed to proceed.   History of Present Illness: Patient is evaluated by phone session.  He is taking Zoloft which is helping his mood depression and also helping his attention and concentration.  He had stopped taking Concerta more than 6 months ago.  He had not noticed any worsening of his symptoms.  He is able to function with the Zoloft.  His job is going well.  He works for [employer] for city and his job is going well.  Recently he is trying to get the diabetic shoe to help his walking.  His appetite is okay.  His energy level is good.  He is sleeping good.  He denies any crying spells or any feeling of hopelessness or worthlessness.  His weight is unchanged from the past.  He is keeping appointment with his endocrinologist.   Past Psychiatric History:  H/O  ADHD.  In the past year tried Adderall.  He is taking Concerta and Zoloft since 2006.  Psychiatric Specialty Exam: Physical Exam  Review of Systems  Weight 171 lb (77.6 kg).There is no height or weight on file to calculate BMI.  General Appearance: NA  Eye Contact:  NA  Speech:  Slow  Volume:  Decreased  Mood:  Euthymic  Affect:  NA  Thought Process:  Goal Directed  Orientation:  Full (Time, Place, and Person)  Thought Content:  WDL and Logical  Suicidal Thoughts:  No  Homicidal Thoughts:  No  Memory:  Immediate;   Good Recent;   Good Remote;   Good  Judgement:   Intact  Insight:  Present  Psychomotor Activity:  NA  Concentration:  Concentration: Fair and Attention Span: Fair  Recall:  Good  Fund of Knowledge:  Good  Language:  Good  Akathisia:  No  Handed:  Right  AIMS (if indicated):     Assets:  Communication Skills Desire for Improvement Housing Social Support Talents/Skills Transportation  ADL's:  Intact  Cognition:  WNL  Sleep:   ok      Assessment and Plan: Generalized anxiety disorder.  Attention deficit disorder, inattentive type.  Patient is stable on low-dose Zoloft which is helping his attention, focus, anxiety.  Discussed medication side effects and benefits.  Continue Zoloft 50 mg daily.  Recommended to call us back if is any question or any concern.  Follow-up in 3 months.  Follow Up Instructions:    I discussed the assessment and treatment plan with the patient. The patient was provided an opportunity to ask questions and all were answered. The patient agreed with the plan and demonstrated an understanding of the instructions.   The patient was advised to call back or seek an in-person evaluation if the symptoms worsen or if the condition fails to improve as anticipated.  I provided 12 minutes of non-face-to-face time during this encounter.   Cleotis Nipper, MD

## 2022-06-15 ENCOUNTER — Ambulatory Visit (INDEPENDENT_AMBULATORY_CARE_PROVIDER_SITE_OTHER): Payer: BC Managed Care – PPO | Admitting: Family Medicine

## 2022-06-15 ENCOUNTER — Encounter: Payer: Self-pay | Admitting: Family Medicine

## 2022-06-15 DIAGNOSIS — E1065 Type 1 diabetes mellitus with hyperglycemia: Secondary | ICD-10-CM

## 2022-06-15 NOTE — Progress Notes (Signed)
Medical Nutrition Therapy PCP None currently [name], MD retired)  Endocrinologist Dorisann Frames, MD  Mom Georgetta Haber Appt start time: 1600 end time: 1700 (1 hour) Primary concerns today: Blood sugar control.  Relevant history/background: Vyan was referred by Dorisann Frames, MD for MNT related to Type 1 diabetes.  He was diagnosed with DM1 ~10 years ago, but hasn't seen an RD for a while.  Wants help in decreasing dietary carbohydrate and portion cntrl.  A1c was 9.5 on 05/20/21.  Other diagnoses include hypothyroidism, low HDL (39 on 05/20/21) and elevated TG (197 on 05/20/21).  Instructions provided by Dr. Talmage Nap on 05/27/21 include:  Reduce saturated & trans fats, increase omega-3 fat sources and soluble fiber; exercise regularly; and limit alcohol intake [name] does not drink alcohol).    Assessment:  Daelynn was accompanied by his mom at today's appt.  He stayed with his grandmother for the last 2 weeks, and did better meeting bedtime goals while there, but was back to an 11:30 PM bedtime as of last night, his first night back home.  Still not exercising regularly.  Discussed today making screen time at night contingent on his 30-min bike ride after dinner.  A1c was 7.7% on 03/07/22.    Insulin (Novolog) dosing remains the same: 35 U in AM.  If dinnertime BG is <120, 30 U; if >120, 40 U. Weight 174.6 lb.  (173.2 lb on 05/13/22; ht is 64".) Recent eating pattern: 3 meals and 2 snacks per day. FBG have been running 120s to 150s,with range of 92-156 in the past 2 weeks.  FBG was 156 this AM.    Javarri has consistently documented progress on goals as well as BG levels.   Progress the past 3 weeks: Behavioral goals Meeting goals  11 PM bedtime:  6-7 X wk <2 starches/meal:  Meeting goal most meals >30 min exercise:  <1 day/week (stat bike or mowing grass)  24-hr recall:  (Up at 6:45 AM) B (7:10 AM)-  2 c Cheerios, 1 c Fairlife f-f milk, 5 slcs chx bacon  Snk (10 AM)-  1 apple, handful of  chips, diet soda L (12 PM)-  1 [place] sandwich, 1/2 cucumber, handful baby carrots, diet soda Snk (3 PM)-  12 oz diet soda D (8 PM)-  2 chx fingers, 2 c steamed cabbage, 1 baked potato, 1 tbsp butter, unsweet tea,  Snk (9 PM)-  1 c Carb-smart ice cream  Typical day? Yes.    Nutritional Diagnosis: Some progress on NI-5.8.2 Excessive carbohydrate intake as related to meal components as evidenced by patient usually meeting carb recommendations, although not always checking carb content of unfamiliar foods.    Intervention: Reviewed diet and exercise history, and again discussed need for more sleep.    Handouts given during visit include: After-Visit Summary (AVS)  Barriers to learning/adherence to lifestyle change: Lives alone, limited culinary skills.    Monitoring/Evaluation:  Dietary intake, exercise, FBG, and body weight in 10 week(s).

## 2022-06-24 DIAGNOSIS — E1065 Type 1 diabetes mellitus with hyperglycemia: Secondary | ICD-10-CM | POA: Diagnosis not present

## 2022-06-24 DIAGNOSIS — E781 Pure hyperglyceridemia: Secondary | ICD-10-CM | POA: Diagnosis not present

## 2022-06-24 DIAGNOSIS — E02 Subclinical iodine-deficiency hypothyroidism: Secondary | ICD-10-CM | POA: Diagnosis not present

## 2022-07-02 DIAGNOSIS — E1065 Type 1 diabetes mellitus with hyperglycemia: Secondary | ICD-10-CM | POA: Diagnosis not present

## 2022-07-02 DIAGNOSIS — E02 Subclinical iodine-deficiency hypothyroidism: Secondary | ICD-10-CM | POA: Diagnosis not present

## 2022-07-07 ENCOUNTER — Telehealth: Payer: Self-pay | Admitting: Podiatry

## 2022-07-07 NOTE — Telephone Encounter (Signed)
Patients mother called and stated her son seen Dr. Logan Bores for Diabetic shoes which have been received. She received a bill for $850.00 which should have been covered by insurance per the endocrinologist. There needed to be a prescription sent to the insurance company or the endocrinologist to where it should been covered. That was a large amount that was charged and they didn't know about it.  Please advise

## 2022-07-09 NOTE — Telephone Encounter (Signed)
Justine Null and Chip Boer,  I added both of you to this thread to see if maybe we could figure out how to resolve this issue.  Thanks much Dr. Logan Bores

## 2022-07-12 NOTE — Telephone Encounter (Signed)
Garnette Czech I added Keely to this thread... I'm okay with whatever... write it off, set up payment plan... discount it. I'll let you and Clarita Crane make that decision when she gets back. Thanks. - Dr. Logan Bores

## 2022-07-16 DIAGNOSIS — E1065 Type 1 diabetes mellitus with hyperglycemia: Secondary | ICD-10-CM | POA: Diagnosis not present

## 2022-07-19 NOTE — Telephone Encounter (Signed)
Was a financial responsibility form signed? They at least need to set up a patient plan to pay at least half of the shoes.

## 2022-07-21 ENCOUNTER — Telehealth: Payer: Self-pay | Admitting: Podiatry

## 2022-07-21 ENCOUNTER — Other Ambulatory Visit: Payer: Self-pay | Admitting: Podiatry

## 2022-07-21 DIAGNOSIS — E109 Type 1 diabetes mellitus without complications: Secondary | ICD-10-CM

## 2022-07-21 DIAGNOSIS — E0843 Diabetes mellitus due to underlying condition with diabetic autonomic (poly)neuropathy: Secondary | ICD-10-CM

## 2022-07-21 NOTE — Telephone Encounter (Signed)
Received a call from [facility] @ Dr Willeen Cass office and they are asking for the orders for the pts diabetic shoes and inserts. They are trying to help the pt get the diabetic shoes/inserts covered.  If you could please just put in a order into the chart I can fax to Dr Willeen Cass office. The fax # is [phone]

## 2022-07-21 NOTE — Telephone Encounter (Signed)
Order placed in patient's chart.  Thanks, Dr. Logan Bores

## 2022-07-22 ENCOUNTER — Other Ambulatory Visit: Payer: Self-pay | Admitting: Podiatry

## 2022-07-22 NOTE — Telephone Encounter (Signed)
Faxed order and notes to [facility] @ Dr Willeen Cass office

## 2022-07-22 NOTE — Addendum Note (Signed)
Addended by: Felecia Shelling on: 07/22/2022 09:09 AM   Modules accepted: Orders

## 2022-07-22 NOTE — Telephone Encounter (Signed)
I went back to that encounter date and placed a new DME diabetic shoe order.  Hopefully that works.  Thanks, Dr. Logan Bores

## 2022-07-27 ENCOUNTER — Telehealth: Payer: Self-pay | Admitting: Podiatry

## 2022-07-27 NOTE — Telephone Encounter (Signed)
I went back to the patient's encounter on 03/08/2022 and placed an order for diabetic shoes.  Please fax to Dr. Willeen Cass office. Thanks, Dr. Logan Bores

## 2022-07-27 NOTE — Telephone Encounter (Signed)
Ladona Ridgel from Dr Willeen Cass office called asking if they could get a rx for pts diabetic shoes and inserts he received from our office on 3.22.2023. The fax number is [phone]

## 2022-07-27 NOTE — Telephone Encounter (Signed)
Written prescription given to [facility].  She will fax in the a.m.  Thanks, Dr. Logan Bores

## 2022-07-27 NOTE — Addendum Note (Signed)
Addended by: Felecia Shelling on: 07/27/2022 04:28 PM   Modules accepted: Orders

## 2022-07-28 NOTE — Telephone Encounter (Signed)
RX faxed over to Dr. Gilman Schmidt office.

## 2022-08-27 DIAGNOSIS — E1065 Type 1 diabetes mellitus with hyperglycemia: Secondary | ICD-10-CM | POA: Diagnosis not present

## 2022-08-27 DIAGNOSIS — E02 Subclinical iodine-deficiency hypothyroidism: Secondary | ICD-10-CM | POA: Diagnosis not present

## 2022-09-01 ENCOUNTER — Encounter (HOSPITAL_COMMUNITY): Payer: Self-pay | Admitting: Psychiatry

## 2022-09-01 ENCOUNTER — Telehealth (HOSPITAL_BASED_OUTPATIENT_CLINIC_OR_DEPARTMENT_OTHER): Payer: BC Managed Care – PPO | Admitting: Psychiatry

## 2022-09-01 DIAGNOSIS — F411 Generalized anxiety disorder: Secondary | ICD-10-CM | POA: Diagnosis not present

## 2022-09-01 DIAGNOSIS — F900 Attention-deficit hyperactivity disorder, predominantly inattentive type: Secondary | ICD-10-CM

## 2022-09-01 MED ORDER — SERTRALINE HCL 50 MG PO TABS
50.0000 mg | ORAL_TABLET | Freq: Every day | ORAL | 0 refills | Status: DC
Start: 2022-09-01 — End: 2022-12-01

## 2022-09-01 NOTE — Progress Notes (Signed)
Virtual Visit via Telephone Note  I connected with Alexander Wilkerson on 09/01/22 at  3:20 PM EDT by telephone and verified that I am speaking with the correct person using two identifiers.  Location: Patient: Home Provider: Home Office   I discussed the limitations, risks, security and privacy concerns of performing an evaluation and management service by telephone and the availability of in person appointments. I also discussed with the patient that there may be a patient responsible charge related to this service. The patient expressed understanding and agreed to proceed.   History of Present Illness: Patient is evaluated by phone session.  He is taking Zoloft 50 mg daily which is helping his depression and anxiety.  He is still with the [employer] for city and job is going well.  He is busy but job is manageable.  He sleeps good.  He is seeing Dr. Vernell Leep for his diabetes and there were no recent changes.  His appetite is okay.  His weight is stable.  He denies any crying spells, panic attack, feeling of hopelessness or worthlessness.  He sleeps good and like to keep his current medication.  He has not taken stimulant in a while because he feels the Zoloft helping his attention and concentration.    Past Psychiatric History:  H/O  ADHD.  In the past year tried Adderall.  He is taking Concerta and Zoloft since 2006.  Psychiatric Specialty Exam: Physical Exam  Review of Systems  Weight 176 lb (79.8 kg).There is no height or weight on file to calculate BMI.  General Appearance: NA  Eye Contact:  NA  Speech:  Clear and Coherent  Volume:  Normal  Mood:  Euthymic  Affect:  NA  Thought Process:  Goal Directed  Orientation:  Full (Time, Place, and Person)  Thought Content:  Logical  Suicidal Thoughts:  No  Homicidal Thoughts:  No  Memory:  Immediate;   Good Recent;   Good Remote;   Good  Judgement:  Good  Insight:  Good  Psychomotor Activity:  NA  Concentration:   Concentration: Fair and Attention Span: Fair  Recall:  Good  Fund of Knowledge:  Good  Language:  Good  Akathisia:  No  Handed:  Right  AIMS (if indicated):     Assets:  Communication Skills Desire for Improvement Housing Social Support Talents/Skills Transportation  ADL's:  Intact  Cognition:  WNL  Sleep:   ok      Assessment and Plan: Generalized anxiety disorder.  Attention deficit disorder, inattentive type.  Patient is stable on low-dose Zoloft 50 mg which is helping his anxiety, depression and attention focus.  Discussed medication side effects and benefits.  Recommended to call us back if is any question or any concern.  Follow-up in 3 months.  Follow Up Instructions:    I discussed the assessment and treatment plan with the patient. The patient was provided an opportunity to ask questions and all were answered. The patient agreed with the plan and demonstrated an understanding of the instructions.   The patient was advised to call back or seek an in-person evaluation if the symptoms worsen or if the condition fails to improve as anticipated.  Collaboration of Care: Primary Care Provider AEB notes are available in epic to review.  Patient/Guardian was advised Release of Information must be obtained prior to any record release in order to collaborate their care with an outside provider. Patient/Guardian was advised if they have not already done so to contact  the registration department to sign all necessary forms in order for [place] to release information regarding their care.   Consent: Patient/Guardian gives verbal consent for treatment and assignment of benefits for services provided during this visit. Patient/Guardian expressed understanding and agreed to proceed.    I provided 19 minutes of non-face-to-face time during this encounter.   Cleotis Nipper, MD

## 2022-09-09 ENCOUNTER — Ambulatory Visit: Payer: BC Managed Care – PPO | Admitting: Family Medicine

## 2022-10-11 NOTE — Progress Notes (Signed)
Medical Nutrition Therapy PCP None currently [name], MD retired)  Endocrinologist Jacelyn Pi, MD  [place] Appt start time: 1600 end time: 1700 (1 hour) Primary concerns today: Blood sugar control.  Relevant history/background: Jazziel was referred by Jacelyn Pi, MD for MNT related to Type 1 diabetes.  He was diagnosed with DM1 ~10 years ago, but hasn't seen an RD for a while.  Wants help in decreasing dietary carbohydrate and portion cntrl.  A1c was 9.5 on 05/20/21.  Other diagnoses include hypothyroidism, low HDL (39 on 05/20/21) and elevated TG (197 on 05/20/21).  Instructions provided by Dr. Chalmers Cater on 05/27/21 include:  Reduce saturated & trans fats, increase omega-3 fat sources and soluble fiber; exercise regularly; and limit alcohol intake [name] does not drink alcohol).    Assessment:  Jeovanny has met with Dr. Almetta Lovely NP; discussing possibility of a pump.  His weight is up again today, as are BGs despite addition of 5 U Novolog at lunchtime.  Although Dannel felt he consuming "a bit more" than a portion size of Carb-Smart ice cream nightly, his mom shared that he is going through 22 servings in <1 week, which equals >2 cups per serving, or ~69 g carb (vs. intended 19 grams.    A1c was 7.7% on 03/07/22.    Insulin (Novolog) dosing: 40 U in AM, 5 U at lunch (new dose), and if dinnertime BG is <120, 30 U; if >120, 40 U. Weight 177.8 lb.  (174.6 lb on 06/15/22; ht is 64".) Recent eating pattern: 3 meals and 2 snacks per day. FBG have been running 120s to 190s,with range of 120-198 in the past 2 weeks.  FBG was 183 this AM.   Recent physical activity:  Joined [name] 4-5 wks ago: Mon & Thurs 45-60 min wt training, plays 20-30 min basketball; Sat ~60-90 min plays in gym: basketball, sometimes pickleball.   Shrihan has consistently documented progress on goals as well as BG levels.   Progress the past 2 weeks: Behavioral goals Meeting goals  11 PM bedtime:  ~1 X  wk <2 starches/meal:  most meals, although still not aware of carb content of all carb sources >30 min exercise:  3 X wk (see physical activity above)  24-hr recall:  (Up at 7 AM) B (7:15 AM)-  2 c Cheerios, 1 c fat-free milk, 5 slc chx bacon Snk (10 AM)-  1 apple, 12 oz diet soda L (12 PM)-  1 [place] sandwich, 1 baby cucumber, baby carrots, 8-9 no-sugar oatmeal cookies, 12 oz diet soda Snk ( PM)-  --- D (5:45 PM)-  4 oz rotisserie chx, sug-free BBQ sauce, 2 c zucchini, 1 c fried okra, 2/3 c creamed corn, diet sweet tea Snk (9 PM)-  2 c carb-smart ice cream Typical day? Yes.    Nutritional Diagnosis: No progress on NI-5.8.2 Excessive carbohydrate intake as related to meal components as evidenced by patient usually meeting carb recommendations, although still not always checking carb content of foods.    Intervention: Reviewed diet and exercise history, and discussed benefits of tighter dietary carb control vs. increased insulin.  As suggested by [employer], we agreed to narrow the focus to two goals for the next month - meeting bedtime goal and limiting evening ice cream portion.    Handouts given during visit include: After-Visit Summary (AVS) Goals sheet (modified)   Barriers to learning/adherence to lifestyle change: Lives alone, limited culinary skills.    Monitoring/Evaluation:  Dietary intake, exercise, FBG, and  body weight in 3 week(s).

## 2022-10-12 ENCOUNTER — Ambulatory Visit (INDEPENDENT_AMBULATORY_CARE_PROVIDER_SITE_OTHER): Payer: BC Managed Care – PPO | Admitting: Family Medicine

## 2022-10-12 ENCOUNTER — Encounter: Payer: Self-pay | Admitting: Family Medicine

## 2022-10-12 DIAGNOSIS — E1065 Type 1 diabetes mellitus with hyperglycemia: Secondary | ICD-10-CM

## 2022-10-12 NOTE — Patient Instructions (Signed)
ANY time you eat a food that contains carbohydrate (whether from sugar or from starch), make sure you read the total carb content, and figure out how much you are getting in the portion size you are eating.   Remember what one carb portion is.               - ONE slice of bread (or its equivalent, such as half of a hamburger bun).              - 1/2 cup of a "scoopable" starchy food such as potatoes or rice.              - 15 grams of Total Carbohydrate as shown on food label.   Limit portion size of Carb-Smart ice cream to 2/3 cup (which = a level ramekin amount.) Total carb of 2/3 cup Carb-Smart ice cream = 16 grams.  Get more small-size ramekins from your parents.     - Recommendation: Eat dinner no later than 6:30, and have your ice cream no later than 7:30 PM except on workout nights, have your ice cream after [name].    If you fall asleep in front of the TV, this means you've gone way past your body's preferred bedtime by then.   Goals for the next month:  1. Go to bed no later than 11 PM including on weekends.      Set a phone alarm for 10:30 to remind you to get ready for bed.   2. Limit ice cream portions to 2/3 cup (one ramekan).     Document on your Goals Sheet what time you went to bed each night, and record the number of minutes you exercise.    Ultimate Goals:  1. Go to bed no later than 11 PM including on weekends.      Set a phone alarm for 10:30 to remind you to get ready for bed.   2. Limit starchy foods to TWO per meal and ONE per snack. ONE portion of a starchy food is equal to the following:              - ONE slice of bread (or its equivalent, such as half of a hamburger bun).              - 1/2 cup of a "scoopable" starchy food such as potatoes or rice.              - 15 grams of Total Carbohydrate as shown on food label.  3. Continue gym workouts of at least 45 minutes 3 days a week AND on non-[name] days, move for at least 5 minutes at least 4 times between after  work and bedtime.      Follow-up: In-office appointment on Tuesday, Nov 14 at 4 PM.

## 2022-11-01 NOTE — Progress Notes (Unsigned)
Medical Nutrition Therapy PCP None currently [name], MD retired)  Endocrinologist Jacelyn Pi, MD  [place] Appt start time: 1600 end time: 1700 (1 hour) Primary concerns today: Blood sugar control.  Relevant history/background: [name] was referred by Jacelyn Pi, MD for MNT related to Type 1 diabetes.  He was diagnosed with DM1 ~10 years ago, but hasn't seen an RD for a while.  Wants help in decreasing dietary carbohydrate and portion cntrl.  A1c was 9.5 on 05/20/21.  Other diagnoses include hypothyroidism, low HDL (39 on 05/20/21) and elevated TG (197 on 05/20/21).  Instructions provided by Dr. Chalmers Cater on 05/27/21 include:  Reduce saturated & trans fats, increase omega-3 fat sources and soluble fiber; exercise regularly; and limit alcohol intake [name] does not drink alcohol).    Assessment:  Weight is up 1 lb today.  [name] has limited his ice cream to 2/3 cup portion about half the time, but met his bedtime goal only once in the past 3 weeks.  He has been consistent with exercise 3 X wk, and he has consistently documented progress on goals as well as BG levels.   Insulin (Novolog) dosing: 40 U in AM, 5 U at lunch, and if dinnertime BG is <120, 30 U; if >120, 40 U. Weight  179.0 lb.  (177.8 lb on 10/12/22; ht is 64".) Recent eating pattern: 3 meals and 2 snacks per day. FBG have ranged from 120-196 in the past 2 weeks.   Most are >160.   Recent physical activity:  [name]: Mon & Thurs 45-60 min wt training, 20-30 min basketball; Sat 60-90 min plays in gym: basketball, sometimes pickleball.   Progress the past 2 weeks: Behavioral goals  Meeting goals  11 PM bedtime:   Not meeting goal 2/3 ice cream portion:  ~half the time  24-hr recall:  (Up at 6:45 AM) B (7:15 AM)-  2 c Cheerios, 1 c fat-free milk, 5 slc chx bacon Snk (10 AM)-  1 apple, 12 oz diet soda L (12 PM)-  1 [place] sandwich on [place] low-CHO bread, baby cuc's, carrots, 6 mini sugar-free cookies Snk  ( PM)-  --- D (6 PM)-  Rotisserie chx, sugar-free BBQ sauce, 1 baked potato, 2 c sauteed yellow squash, diet sweet tea, diet soda Snk (9 PM)-  1 c CarbSmart ice cream Typical day? Yes.     Nutritional Diagnosis: Some progress noted on NI-5.8.2 Excessive carbohydrate intake as related to meal components as evidenced by patient limiting nightly ice cream portion to 2.3 cup ~half the time and limiting sugar-free cookies to 1 portion of 6 cookies (although still not always checking carb content of foods).    Intervention: Reviewed diet and exercise history, and again emphasized importance of sleep to helping him manage his weight and BG.    Handouts given during visit include: After-Visit Summary (AVS) Goals sheet   Barriers to learning/adherence to lifestyle change: Lives alone, limited culinary skills.    Monitoring/Evaluation:  Dietary intake, exercise, FBG, and body weight in 4 week(s).

## 2022-11-02 ENCOUNTER — Ambulatory Visit (INDEPENDENT_AMBULATORY_CARE_PROVIDER_SITE_OTHER): Payer: BC Managed Care – PPO | Admitting: Family Medicine

## 2022-11-02 ENCOUNTER — Encounter: Payer: Self-pay | Admitting: Family Medicine

## 2022-11-02 VITALS — Ht 64.0 in | Wt 179.0 lb

## 2022-11-02 DIAGNOSIS — E1065 Type 1 diabetes mellitus with hyperglycemia: Secondary | ICD-10-CM | POA: Diagnosis not present

## 2022-11-02 NOTE — Patient Instructions (Addendum)
When you leave today's appt, call your mom, and ask her to bring over at least 6 RAMEKINS tomorrow.    Potatoes:  Try [employer], which have a very thin skin, so you may find it perfectly acceptable to eat the skin and flesh together.  The skin provides fiber, which is beneficial in slowing blood sugar rise at least a bit.  (You can also lower the glycemic response - how much it raises your blood sugar - by chilling a cooked potato, and eating it cold or even once it's re-heated.)  ANY time you eat a food that contains carbohydrate (whether from sugar or from starch), make sure you read the total carb content, and figure out how much you are getting in the portion size you are eating.   Remember what one carb portion is.               - ONE slice of bread (or its equivalent, such as half of a hamburger bun).              - 1/2 cup of a "scoopable" starchy food such as potatoes or rice.              - 15 grams of Total Carbohydrate as shown on food label.     If you fall asleep in front of the TV, this means you've gone way past your body's preferred bedtime by then.   Goals for the next month:  1. Go to bed no later than 11 PM including on weekends.      Set a phone alarm for 10:00 to remind you to get ready for bed.   2. Limit ice cream portions to 2/3 cup (one ramekin).       Recommendation: Eat dinner no later than 6:30, and have your ice cream no later than 7:30 PM except on workout nights, have your ice cream after [name].    Talk to your mom about establishing some kind of reward for meeting your bedtime goal.  For example, you could say you've earned a reward if you stick to your bedtime goal at least 23 days out of ever 4 weeks.  Discuss: What seems like the right number you have to meet, and what can you use as a reward?    Document on your Goals Sheet what time you went to bed each night, and record the number of minutes you exercise.    Ultimate Goals:  1. Go to bed no  later than 11 PM including on weekends.      Set a phone alarm for 10:30 to remind you to get ready for bed.   2. Limit starchy foods to TWO per meal and ONE per snack. ONE portion of a starchy food is equal to the following:              - ONE slice of bread (or its equivalent, such as half of a hamburger bun).              - 1/2 cup of a "scoopable" starchy food such as potatoes or rice.              - 15 grams of Total Carbohydrate as shown on food label.  3. Continue gym workouts of at least 45 minutes 3 days a week AND on non-[name] days, move for at least 5 minutes at least 4 times between after work and bedtime.      Follow-up: In-office  appointment on Tuesday, Dec 12 at 4 PM.

## 2022-11-10 DIAGNOSIS — E781 Pure hyperglyceridemia: Secondary | ICD-10-CM | POA: Diagnosis not present

## 2022-11-10 DIAGNOSIS — E02 Subclinical iodine-deficiency hypothyroidism: Secondary | ICD-10-CM | POA: Diagnosis not present

## 2022-11-10 DIAGNOSIS — E1065 Type 1 diabetes mellitus with hyperglycemia: Secondary | ICD-10-CM | POA: Diagnosis not present

## 2022-11-29 NOTE — Progress Notes (Unsigned)
Medical Nutrition Therapy PCP None currently Alexander Gavel, MD retired)  Endocrinologist Alexander Frames, MD  Mom Alexander Wilkerson Appt start time: 1600 end time: 1700 (1 hour) Primary concerns today: Blood sugar control.  Relevant history/background: Bee was referred by Alexander Frames, MD for MNT related to Type 1 diabetes.  He was diagnosed with DM1 ~2012, but hasn't seen an RD in several years.  Wants help in decreasing dietary carbohydrate and portion cntrl.  A1c was 9.5 on 05/20/21.  Other diagnoses include hypothyroidism, low HDL (39 on 05/20/21) and elevated TG (197 on 05/20/21).  Instructions provided by Dr. Talmage Nap on 05/27/21 include:  Reduce saturated & trans fats, increase omega-3 fat sources and soluble fiber; exercise regularly; and limit alcohol intake Alexander Wilkerson does not drink alcohol).    Assessment:   Alexander Wilkerson again did a good job documenting on his goals sheet (see progress below).  He now has several of the 2/3-cup ramekins, so limiting his ice cream to that portion is easier.  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 11 PM), with the expectation that he will meet the goal more often, and perhaps realize more consistency in it.   A1c on 11/10/22 was 7.4, which is down from previous 8.5%.  Dr. Talmage Nap will check A1c again in March 2024.    Insulin (Novolog) dosing: 40 U in AM, 5 U at lunch, and if dinnertime BG is <120, 30 U; if >120, 40 U. Weight  176.4 lb.  (179.0 lb on 11/02/22; ht is 64".) Recent eating pattern: 3 meals and 2 snacks per day. FBG have ranged from 90's-190's, with most >150.   Recent physical activity:  [name]: Mon & Thurs 45-60 min wt training, 20-30 min basketball; Sat 60-90 min plays in gym: basketball, sometimes pickleball.   Progress the past 2 weeks: Behavioral goals  Meeting goals  11 PM bedtime:   ~1 X wk.  2/3 ice cream portion:  4-5 X wk.   24-hr  recall:  (Up at 7 AM) B (7:15 AM)-  2 c Cheerios, 1 c [employer] fat-free milk, 5 slices chx bacon Snk (10 AM)-  1 apple, diet soda L (12 PM)-  1 [place] sandwich, carrots, cucumbers, 6 sugar-free cookies, diet soda Snk ( PM)-  --- D (6 PM)-  1 c rotisserie chx, sugar-free bbq sauce, 1 c grn beans, 1 c potatoes, diet sweet tea Snk ( PM)-  2/3 c Carb-smart ice cream Typical day? Yes.     Nutritional Diagnosis: Some progress noted on NI-5.8.2 Excessive carbohydrate intake as related to meal components as evidenced by patient limiting nightly ice cream portion to 2.3 cup most nights and limiting sugar-free cookies to 1 portion of 6 cookies.    Intervention: Reviewed diet and exercise history, and again emphasized importance of sleep to helping him manage his weight and BG.    Handouts given during visit include: After-Visit Summary (AVS) Goals sheet   Barriers to learning/adherence to lifestyle change: Lives alone, limited culinary skills.    Monitoring/Evaluation:  Dietary intake, exercise, FBG, and body weight in 7 week(s).

## 2022-11-30 ENCOUNTER — Encounter: Payer: Self-pay | Admitting: Family Medicine

## 2022-11-30 ENCOUNTER — Ambulatory Visit (INDEPENDENT_AMBULATORY_CARE_PROVIDER_SITE_OTHER): Payer: BC Managed Care – PPO | Admitting: Family Medicine

## 2022-11-30 VITALS — Ht 64.0 in | Wt 176.4 lb

## 2022-11-30 DIAGNOSIS — E1065 Type 1 diabetes mellitus with hyperglycemia: Secondary | ICD-10-CM | POA: Diagnosis not present

## 2022-11-30 NOTE — Patient Instructions (Addendum)
ANY time you eat a food that contains carbohydrate (whether from sugar or from starch), make sure you read the total carb content, and figure out how much you are getting in the portion size you are eating.   Remember what one carb portion is.               - ONE slice of bread (or its equivalent, such as half of a hamburger bun).              - 1/2 cup of a "scoopable" starchy food such as potatoes or rice.              - 15 grams of Total Carbohydrate as shown on food label.    Goals for the next month:  1. Go to bed no later than 11:30 PM including on weekends.      Set a phone alarm for 10:00 to remind you to get ready for bed.   2. Limit ice cream portions to 2/3 cup (one ramekin).       Recommendation: Eat dinner no later than 6:30, and have your ice cream no later than 7:30 PM except on workout nights, have your ice cream after [name].     Document on your Goals Sheet what time you went to bed each night, and record the number of minutes you exercise.  Also on your Goals Sheet, write the number of times per week that you meet each of your bedtime goal.    Talk to your mom about establishing some kind of reward for meeting your bedtime goal.  For example, you could say you've earned a reward if you stick to your bedtime goal at least 23 days out of ever 4 weeks.  Discuss: What seems like the right number you have to meet, and what can you use as a reward?    Check out the schedule at [facility], so you know when the gym is available for  basketball or pickleball.  Once you are not going to work each day, you'll have some extra time to do some fun activities at [employer].    Ultimate Goals:  1. Go to bed no later than 11 PM including on weekends.      Set a phone alarm for 10:00 to remind you to get ready for bed.   2. Limit starchy foods to TWO per meal and ONE per snack. ONE portion of a starchy food is equal to the following:              - ONE slice of bread (or its equivalent, such as  half of a hamburger bun).              - 1/2 cup of a "scoopable" starchy food such as potatoes or rice.              - 15 grams of Total Carbohydrate as shown on food label.  3. Continue gym workouts of at least 45 minutes 3 days a week AND on non-[name] days, move for at least 5 minutes at least 4 times between after work and bedtime.      Follow-up: In-office appointment on Tuesday, Jan 30 at 1:30 PM.

## 2022-12-01 ENCOUNTER — Telehealth (HOSPITAL_BASED_OUTPATIENT_CLINIC_OR_DEPARTMENT_OTHER): Payer: BC Managed Care – PPO | Admitting: Psychiatry

## 2022-12-01 ENCOUNTER — Encounter (HOSPITAL_COMMUNITY): Payer: Self-pay | Admitting: Psychiatry

## 2022-12-01 DIAGNOSIS — F900 Attention-deficit hyperactivity disorder, predominantly inattentive type: Secondary | ICD-10-CM

## 2022-12-01 DIAGNOSIS — F411 Generalized anxiety disorder: Secondary | ICD-10-CM

## 2022-12-01 MED ORDER — SERTRALINE HCL 50 MG PO TABS: 50.0000 mg | ORAL_TABLET | Freq: Every day | ORAL | 0 refills | Status: DC

## 2022-12-01 NOTE — Progress Notes (Signed)
Virtual Visit via Telephone Note  I connected with Alexander Wilkerson on 12/01/22 at  2:20 PM EST by telephone and verified that I am speaking with the correct person using two identifiers.  Location: Patient: Home Provider: Home Office   I discussed the limitations, risks, security and privacy concerns of performing an evaluation and management service by telephone and the availability of in person appointments. I also discussed with the patient that there may be a patient responsible charge related to this service. The patient expressed understanding and agreed to proceed.   History of Present Illness: Patient is evaluated by phone session.  He is taking Zoloft 50 mg daily.  He reported things are going well.  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  He had a good Thanksgiving.  He was able to visit his family.  Recently he has a visit with his endocrinologist and he was happy that his hemoglobin A1c dropped from 8.5-7.4.  Patient also started going to [facility] twice a week he noticed improvement in his energy level.  He sleeps good.  He is taking his diabetes medication.  He has no tremors, shakes or any EPS.  He denies any panic attack or any crying spells.  Denies any suicidal thoughts and like to keep the current dose of Zoloft.  He is not taking stimulant because Zoloft is helping his attention and concentration.   Past Psychiatric History:  H/O  ADHD.  In the past year tried Adderall.  He is taking Concerta and Zoloft since 2006.   Psychiatric Specialty Exam: Physical Exam  Review of Systems  Weight 177 lb (80.3 kg).There is no height or weight on file to calculate BMI.  General Appearance: NA  Eye Contact:  NA  Speech:  Slow  Volume:  Normal  Mood:  Euthymic  Affect:  NA  Thought Process:  Goal Directed  Orientation:  Full (Time, Place, and Person)  Thought Content:  Logical  Suicidal  Thoughts:  No  Homicidal Thoughts:  No  Memory:  Immediate;   Good Recent;   Good Remote;   Good  Judgement:  Intact  Insight:  Present  Psychomotor Activity:  NA  Concentration:  Concentration: Fair and Attention Span: Fair  Recall:  Good  Fund of Knowledge:  Good  Language:  Good  Akathisia:  No  Handed:  Right  AIMS (if indicated):     Assets:  Communication Skills Desire for Improvement Housing Social Support Talents/Skills Transportation  ADL's:  Intact  Cognition:  WNL  Sleep:   ok      Assessment and Plan: Generalized anxiety disorder.  Attention deficit disorder, inattentive type.  Patient is stable on Zoloft 50 mg daily which is helping his anxiety and ADD symptoms.  Discussed medication side effects and benefits.  Patient has planned to see his family on the Christmas.  Discussed medication side effects and benefits.  Recommended to call us back if is any question the concern.  Follow-up in 3 months.  Follow Up Instructions:    I discussed the assessment and treatment plan with the patient. The patient was provided an opportunity to ask questions and all were answered. The patient agreed with the plan and demonstrated an understanding of the instructions.   The patient was advised to call back or seek an in-person evaluation if the symptoms worsen or if the condition fails  to improve as anticipated.  Collaboration of Care: Other provider involved in patient's care AEB notes are available in epic to review.  Patient/Guardian was advised Release of Information must be obtained prior to any record release in order to collaborate their care with an outside provider. Patient/Guardian was advised if they have not already done so to contact the registration department to sign all necessary forms in order for [place] to release information regarding their care.   Consent: Patient/Guardian gives verbal consent for treatment and assignment of benefits for services provided  during this visit. Patient/Guardian expressed understanding and agreed to proceed.    I provided 14 minutes of non-face-to-face time during this encounter.   Cleotis Nipper, MD

## 2023-01-18 ENCOUNTER — Ambulatory Visit (INDEPENDENT_AMBULATORY_CARE_PROVIDER_SITE_OTHER): Payer: BC Managed Care – PPO | Admitting: Family Medicine

## 2023-01-18 ENCOUNTER — Encounter: Payer: Self-pay | Admitting: Family Medicine

## 2023-01-18 VITALS — Ht 64.0 in | Wt 177.6 lb

## 2023-01-18 DIAGNOSIS — E109 Type 1 diabetes mellitus without complications: Secondary | ICD-10-CM | POA: Diagnosis not present

## 2023-01-18 NOTE — Progress Notes (Signed)
Medical Nutrition Therapy PCP None currently Alexander China, MD retired)  Endocrinologist Alexander Pi, MD  [place] Appt start time: 1330 end time: 1430 (1 hour) Primary concerns today: Blood sugar control.  Relevant history/background: Alexander Wilkerson was referred by Alexander Pi, MD for MNT related to Type 1 diabetes.  He was diagnosed with DM1 ~2012, but hasn't seen an RD in several years.  Wants help in decreasing dietary carbohydrate and portion cntrl.  A1c was 9.5 on 05/20/21.  Other diagnoses include hypothyroidism, low HDL (39 on 05/20/21) and elevated TG (197 on 05/20/21).  Instructions provided by Dr. Chalmers Wilkerson on 05/27/21 include:  Reduce saturated & trans fats, increase omega-3 fat sources and soluble fiber; exercise regularly; and limit alcohol intake [name] does not drink alcohol).    Assessment:   [name] has diligently recorded progress on goals, showing again that he is not meeting his bedtime goal of 11:30 PM.  He has been consistent in limiting his portion of Carb-Smart ice cream, however (6-7 X wk), and he has consistently been doing some kind of physical activity at [employer] [facility].  Weight remains stable, and BG is not improving; pre-dinner BG was well over 150 several times in most weeks.   A1c on 11/10/22 was 7.4, which is down from previous 8.5%.  Dr. Chalmers Wilkerson will check A1c again in March 2024.    Insulin (Novolog) dosing: 40 U in AM, 5 U at lunch, and if dinnertime BG is <120, 30 U; if >120, 40 U; if >150, 45 U. Weight  177.6 lb.  (76.4 lb on 11/30/22; ht is 64".) Recent eating pattern: 3 meals and 2 snacks per day. FBG have ranged from 90's-190's, with most >150.   Recent physical activity:  [name]: Strength wokout 45-60 min 2 X wk, +/- 20-30 min basketball; Sat 60-90 min basketball &/or pickleball.   Progress the past 7 weeks: Behavioral goals  Meeting goal 11:30 PM bedtime:   <1 X wk.  2/3 c ice cream portion:  6-7 X wk.  (Dinner <6:30 PM):    ? (Exercise per wk):  3 X wk  24-hr recall:  (Up at 9:30 AM) B (10:15 AM)-  2 c Cheerios, 1 c 2% milk, 6 slc chx bacon - worked out at [employer] ~1 hour + 30 min basketball; drank water -  Snk ( AM)-   L ( PM)-   Snk (4 PM)-  1 sleeve reduced-fat Pringles, 16 oz diet soda D (7 PM)-  1 c rotisserie chx, 3-4 tbsp sug-free bbq sauce, 1 c green beans, 16 oz  diet soda Snk (9 PM)-  2/3 c carb-smart ice cream, 8 oz diet soda Typical day? No.   Nutritional Diagnosis: Progress noted on NI-5.8.2 Excessive carbohydrate intake as related to meal components as evidenced by patient consistently limiting nightly ice cream portion to 2/3 cup.    Intervention: Reviewed diet and exercise history, and emphasized importance of limiting carb to keep BG lower.  Reminded [facility] that limiting carb vs. ever-increasing insulin doses will help with his weight management, and better weight control will lead to better BG ontrol.    Handouts given during visit include: After-Visit Summary (AVS) Revised goals sheet   Barriers to learning/adherence to lifestyle change: Lives alone, limited culinary skills.    Monitoring/Evaluation:  Dietary intake, exercise, FBG, and body weight in 3 week(s).

## 2023-01-18 NOTE — Patient Instructions (Signed)
ANY time you eat a food that contains carbohydrate (whether from sugar or from starch), make sure you read the total carb content, and figure out how much you are getting in the portion size you are eating.   Remember what one carb portion is.               - ONE slice of bread (or its equivalent, such as half of a hamburger bun).              - 1/2 cup of a "scoopable" starchy food such as potatoes or rice.              - 15 grams of Total Carbohydrate as shown on food label.    - Continue to be diligent with limiting ice cream portions to 2/3 cup (one ramekin).    Goals for the next month:  1. Go to bed no later than 11:30 PM including on weekends.      Set a phone alarm for 10:00 to remind you to get ready for bed.  Place your phone in another room so you have to get up to turn it off.   2. Get a total of 5 exercise sessions per week, which can include gym activity or walking or stationary bike.  (Continue to work out ~60 minutes 2 X wk at [employer] plus one day of pickleball or basketball.) In addition,record:    - Blood sugar levels on goals sheet, including 7-day average to the right of each week.     - Number of minutes of exercise.    During the past 7 weeks, you met your bedtime goal accordingly: 2, 1, 1, 0, 1, 0, and 0 times per week.    Pay attention to your blood sugar levels recorded:  How many times per week are you >140 at dinner time?  What accounts for that?  For example, on Monday your pre-dinner glucose was 182 - because of the chips you had at ~4 PM.  A better snack would have been fresh fruit, peanuts or other nuts, unsweetened apple sauce, peanut butter & crackers, 1/2 sandwich.    Follow-up: In-office appointment on Tuesday, Feb 20 at 1:30 PM.

## 2023-02-08 ENCOUNTER — Ambulatory Visit (INDEPENDENT_AMBULATORY_CARE_PROVIDER_SITE_OTHER): Payer: BC Managed Care – PPO | Admitting: Family Medicine

## 2023-02-08 ENCOUNTER — Encounter: Payer: Self-pay | Admitting: Family Medicine

## 2023-02-08 VITALS — Ht 63.0 in | Wt 176.4 lb

## 2023-02-08 DIAGNOSIS — E109 Type 1 diabetes mellitus without complications: Secondary | ICD-10-CM

## 2023-02-08 NOTE — Patient Instructions (Signed)
ANY time you eat a food that contains carbohydrate (whether from sugar or from starch), make sure you read the total carb content, and figure out how much you are getting in the portion size you are eating.   Remember what one carb portion is.               - ONE slice of bread (or its equivalent, such as half of a hamburger bun).              - 1/2 cup of a "scoopable" starchy food such as potatoes or rice.              - 15 grams of Total Carbohydrate as shown on food label.     Goals for the next month:  1. Have dinner no later than 6:30 PM.       Set a phone alarm for 6 PM to remind you it's dinnertime.  Place your phone away from you so you have to get up to turn it off.   2. Go to bed no later than 11:30 PM including on weekends.       Set a phone alarm for 10:00 to remind you to get ready for bed.  Place your phone in another room so you have to get up to turn it off.   2. Get a total of 5 exercise sessions per week, which can include gym activity or walking or stationary bike.  (Continue to work out ~60 minutes 2 X wk at [employer] plus one day of pickleball or basketball.) In addition,record:    - Blood sugar levels on goals sheet, including 7-day average to the right of each week.     - Number of minutes of exercise.     When you have an unusually low or high blood sugar, immediately turn over your Goals Sheet, and write down what you last ate, what time it was, and how much you ate.     NOTE:  Consider looking into a new primary care physician for yourself.    Follow-up: In-office appointment on Tuesday, March 19 at 1:30 PM.

## 2023-02-08 NOTE — Progress Notes (Signed)
Medical Nutrition Therapy PCP None currently [name], MD retired)  Endocrinologist Jacelyn Pi, MD  [place] Appt start time: 1330 end time: 1430 (1 hour) Primary concerns today: Blood sugar control.  Relevant history/background: Alexander Wilkerson was referred by Jacelyn Pi, MD for MNT related to Type 1 diabetes.  He was diagnosed with DM1 ~2012, but hasn't seen an RD in several years.  Wants help in decreasing dietary carbohydrate and portion cntrl.  A1c was 9.5 on 05/20/21.  Other diagnoses include hypothyroidism, low HDL (39 on 05/20/21) and elevated TG (197 on 05/20/21).  Instructions provided by Dr. Chalmers Cater on 05/27/21 include:  Reduce saturated & trans fats, increase omega-3 fat sources and soluble fiber; exercise regularly; and limit alcohol intake Alexander Wilkerson does not drink alcohol).    Assessment:   Alexander Wilkerson has still not been getting to bed by the goal time of 11:30 PM.  He will be starting back to work April 1, so will have to get up at 6 or 6:30 AM M-F.  His late bedtimes also sometimes lead to a late breakfast and skipped lunch, not especially helpful to glycemic control or weight.  We discussed today the suggestion of eating dinner no later than 6:30 as possibly helpful in achieving an earlier bedtime.  Alexander Wilkerson is willing to try, but expressed skepticism that he will be able to make himself do so.  Yesterday between getting home from [place] (~3:45 PM) and dinner (8 PM), Alexander Wilkerson was watching TV, which is the same activity that usually prevents him from getting to bed earlier.   A1c on 11/10/22 was 7.4; next A1c check in March.    Insulin (Novolog) dosing: 40 U in AM, 5 U at lunch, and if dinnertime BG is <120, 30 U; if >120, 40 U; if >150, 45 U. Weight  176.4 lb. (177.6 lb on 01/18/23; ht is 64".)  Recent eating pattern: 3 meals and 2 snacks per day. FBG have ranged from 104-196, averaging 162.  Alexander Wilkerson Kitchen   Recent physical activity:  [name]: Strength workout 45-60 min 2 X wk, +/-  20-30 min basketball; Sat 60-90 min basketball &/or pickleball.   Progress the past 4 weeks: Behavioral goals  Meeting goal 11:30 PM bedtime:   Not meeting on any day  2/3 c ice cream portion:  7 X wk.  (Exercise per wk):  4-7 X wk (Dinner <6:30 PM):   ?  24-hr recall:  (Up at 10:30 AM) B (11 AM)-  2 c Cheerios, 1 c fat-free milk, 6 slc chx bacon,  Snk ( AM)-  ---   - Went to gym ~1 PM until almost 4 PM: weights for 60 min; basketball 20-30 min; ~20 min water play -  L ( PM)-  --- Snk (4)-   1 apple, diet soda  D (8 PM)-  1 c rotisserie chx, sugar-free bbq sauce, 2 small potatoes, 1 tbsp lite margarine, 1+ c green beans, diet soda Snk (9 PM)-  2/3 c Carb-Smart ice cream Typical day? Yes.   Normal for Mon or Thur ([name] days).  Tends to sleep late these days, missing lunch.    Nutritional Diagnosis: Progress noted on NI-5.8.2 Excessive carbohydrate intake as related to meal components as evidenced by patient consistently limiting nightly ice cream portion to 2/3 cup.    Intervention: Reviewed diet and exercise history, and emphasized importance of limiting carb to keep BG lower.  Reminded Alexander Wilkerson that limiting carb vs. ever-increasing insulin doses will help with his weight management,  and better weight control will lead to better BG ontrol.    Handouts given during visit include: After-Visit Summary (AVS) Revised goals sheet   Barriers to learning/adherence to lifestyle change: Lives alone, limited culinary skills.    Monitoring/Evaluation:  Dietary intake, exercise, FBG, and body weight in 4 week(s).

## 2023-03-02 ENCOUNTER — Telehealth (HOSPITAL_BASED_OUTPATIENT_CLINIC_OR_DEPARTMENT_OTHER): Payer: BC Managed Care – PPO | Admitting: Psychiatry

## 2023-03-02 ENCOUNTER — Encounter (HOSPITAL_COMMUNITY): Payer: Self-pay | Admitting: Psychiatry

## 2023-03-02 DIAGNOSIS — F411 Generalized anxiety disorder: Secondary | ICD-10-CM

## 2023-03-02 DIAGNOSIS — F900 Attention-deficit hyperactivity disorder, predominantly inattentive type: Secondary | ICD-10-CM

## 2023-03-02 DIAGNOSIS — E781 Pure hyperglyceridemia: Secondary | ICD-10-CM | POA: Diagnosis not present

## 2023-03-02 DIAGNOSIS — E02 Subclinical iodine-deficiency hypothyroidism: Secondary | ICD-10-CM | POA: Diagnosis not present

## 2023-03-02 DIAGNOSIS — E1065 Type 1 diabetes mellitus with hyperglycemia: Secondary | ICD-10-CM | POA: Diagnosis not present

## 2023-03-02 MED ORDER — SERTRALINE HCL 50 MG PO TABS: 50.0000 mg | ORAL_TABLET | Freq: Every day | ORAL | 1 refills | Status: DC

## 2023-03-02 NOTE — Progress Notes (Signed)
[name]   Patient Location: Home Provider Location: Home Office  I connect with patient by telephone and verified that I am speaking with correct person by using two identifiers. I discussed the limitations of evaluation and management by telemedicine and the availability of in person appointments. I also discussed with the patient that there may be a patient responsible charge related to this service. The patient expressed understanding and agreed to proceed.  Alexander Wilkerson 32 y.o.  03/02/2023 3:02 PM  History of Present Illness:  Patient is evaluated by phone session.  He is taking Zoloft 50 mg every day.  He noticed anxiety is a stable.  He is going to start his second job on April 1.  He admitted some struggle and issues controlling his blood sugar.  He had a blood work today at Dr. Rayvon Char office and results are pending.  He had a good holidays.  He reported his weight is unchanged from the past.  His energy level is good.  He denies any panic attack, crying spells or any feeling of hopelessness or worthlessness.  He does go outside and does not feel anxious around people.  He has no tremor or shakes or any EPS.  His attention concentration is good.  Like to keep the Zoloft.  Patient not taking any stimulants.  Past Psychiatric History: H/O  ADHD.  In the past year tried Adderall.  He is taking Concerta and Zoloft since 2006.    Outpatient Encounter Medications as of 03/02/2023  Medication Sig   BD PEN NEEDLE NANO U/F 32G X 4 MM MISC U UTD BID   Glucagon (BAQSIMI TWO PACK) 3 MG/DOSE POWD See admin instructions. (Patient not taking: Reported on 02/03/2022)   levothyroxine (SYNTHROID) 50 MCG tablet Take 50 mcg by mouth every morning.   methylphenidate 36 MG PO CR tablet Take 1 tablet (36 mg total) by mouth every morning. (Patient not taking: Reported on 11/30/2021)   NOVOLOG MIX 70/30 FLEXPEN (70-30) 100 UNIT/ML FlexPen     sertraline (ZOLOFT) 50 MG tablet Take 1 tablet (50 mg total) by mouth daily.   No facility-administered encounter medications on file as of 03/02/2023.    No results found for this or any previous visit (from the past 2160 hour(s)).   Psychiatric Specialty Exam: Physical Exam  Review of Systems  Weight 177 lb (80.3 kg).There is no height or weight on file to calculate BMI.  General Appearance: NA  Eye Contact:  NA  Speech:  Clear and Coherent and Normal Rate  Volume:  Normal  Mood:  Euthymic  Affect:  NA  Thought Process:  Goal Directed  Orientation:  Full (Time, Place, and Person)  Thought Content:  Logical  Suicidal Thoughts:  No  Homicidal Thoughts:  No  Memory:  Immediate;   Good Recent;   Good Remote;   Good  Judgement:  Intact  Insight:  Present  Psychomotor Activity:  NA  Concentration:  Concentration: Good and Attention Span: Good  Recall:  Good  Fund of Knowledge:  Good  Language:  Good  Akathisia:  No  Handed:  Right  AIMS (if indicated):     Assets:  Communication Skills Desire for Improvement Housing Resilience Social Support Talents/Skills Transportation  ADL's:  Intact  Cognition:  WNL  Sleep:  ok     Assessment/Plan: Generalized anxiety disorder - Plan: sertraline (ZOLOFT) 50 MG tablet  Attention deficit hyperactivity disorder (ADHD), predominantly inattentive type - Plan: sertraline (  ZOLOFT) 50 MG tablet  Patient is stable on his current medication.  Continue Zoloft 50 mg is helping his anxiety and also attention and focus.  Patient has a blood work at his endocrinologist and results are pending.  Encourage regular exercise and walking.  Follow-up in 6 months.   Follow Up Instructions:     I discussed the assessment and treatment plan with the patient. The patient was provided an opportunity to ask questions and all were answered. The patient agreed with the plan and demonstrated an understanding of the instructions.   The patient was  advised to call back or seek an in-person evaluation if the symptoms worsen or if the condition fails to improve as anticipated.    Collaboration of Care: Other provider involved in patient's care AEB notes are available in epic to review.  Patient/Guardian was advised Release of Information must be obtained prior to any record release in order to collaborate their care with an outside provider. Patient/Guardian was advised if they have not already done so to contact the registration department to sign all necessary forms in order for [place] to release information regarding their care.   Consent: Patient/Guardian gives verbal consent for treatment and assignment of benefits for services provided during this visit. Patient/Guardian expressed understanding and agreed to proceed.     I provided 12 minutes of non face to face time during this encounter.  [name], MD 03/02/2023

## 2023-03-07 DIAGNOSIS — E02 Subclinical iodine-deficiency hypothyroidism: Secondary | ICD-10-CM | POA: Diagnosis not present

## 2023-03-07 DIAGNOSIS — E781 Pure hyperglyceridemia: Secondary | ICD-10-CM | POA: Diagnosis not present

## 2023-03-07 DIAGNOSIS — E1065 Type 1 diabetes mellitus with hyperglycemia: Secondary | ICD-10-CM | POA: Diagnosis not present

## 2023-03-08 ENCOUNTER — Ambulatory Visit: Payer: BC Managed Care – PPO | Admitting: Family Medicine

## 2023-04-05 ENCOUNTER — Ambulatory Visit: Payer: BC Managed Care – PPO | Admitting: Family Medicine

## 2023-04-19 ENCOUNTER — Ambulatory Visit (INDEPENDENT_AMBULATORY_CARE_PROVIDER_SITE_OTHER): Payer: BC Managed Care – PPO | Admitting: Family Medicine

## 2023-04-19 ENCOUNTER — Encounter: Payer: Self-pay | Admitting: Family Medicine

## 2023-04-19 VITALS — Ht 64.0 in | Wt 178.6 lb

## 2023-04-19 DIAGNOSIS — E109 Type 1 diabetes mellitus without complications: Secondary | ICD-10-CM

## 2023-04-19 NOTE — Progress Notes (Signed)
Medical Nutrition Therapy PCP None currently Alexander Gavel, MD retired)  Endocrinologist Dorisann Frames, MD  Mom Alexander Wilkerson Appt start time: 1330 end time: 1430 (1 hour) Primary concerns today: Blood sugar control.  Relevant history/background: Alexander Wilkerson was referred by Dorisann Frames, MD for MNT related to Type 1 diabetes.  He was diagnosed with DM1 ~2012, but hasn't seen an RD in several years.  Wants help in decreasing dietary carbohydrate and portion cntrl.  A1c was 9.5 on 05/20/21.  Other diagnoses include hypothyroidism, low HDL (39 on 05/20/21) and elevated TG (197 on 05/20/21).  Instructions provided by Dr. Talmage Nap on 05/27/21 include:  Reduce saturated & trans fats, increase omega-3 fat sources and soluble fiber; exercise regularly; and limit alcohol intake Alexander Wilkerson does not drink alcohol).    Assessment:   Alexander Wilkerson documented progress on his goals as well as exercise and BG (fasting and pre-dinner) on the Goals Sheet provided.  Alexander Wilkerson is still not exploring carb content for foods he doesn't normally eat.  For example, although he did not look at the carb content on the label, he knew a recent FBG of 193 was related to the 3 corn muffins he ate for dinner the night before.  Similarly, he has been using an instant mashed potato product, which an internet search revealed, provided 72 grams of carb in the portion he ate last night.  I told Alexander Wilkerson today that together with his excellent commitment to consistent exercise, if he were to improve his carb management, he will see the weight loss he desires, and more importantly, better BG control.   A1c in March was 7.1% (down from 7.4% on 11/10/22).    Insulin (Novolog) dosing: 40 U in AM, 5 U at lunch, and if dinnertime BG is <120, 30 U; if >120, 40 U; if >150, 45 U. Weight  178.6 lb. (176.4 lb on 02/08/23; ht is 64".) Recent eating pattern: 3 meals and 2 snacks per day. FBG has ranged from 84-193, averaging 156 last week (160's other weeks).   Recent physical  activity:  [facility]: Strength workout 45-60 min 2 X wk, +/- 20-30 min basketball; 60-90 min basketball &/or pickleball 2 X wk.  Alexander Wilkerson was re-evaluated at [facility] for body comp:  09/02/22  03/30/23 Weight  174.2   174.1 Muscel Mass 64.4   65.7 % Body Fat 34.3    33.0 Also evaluated for sit&reach; push-ups, and body weight squats, but Alexander Wilkerson didn't get info on change from September.    Progress the past 4 weeks: Behavioral goals  Meeting goal Dinner <6:30 PM:   Met goal each day last wk, but has been averaging 2-3 X wk. 11:30 PM bedtime:   Meeting goal 2-4 X wk (vs. 0-1 before April). Ex >5 X wk:    Meeting goal every week.   24-hr recall:  (Up at 6:45 AM) B (7 AM)-  2 c Cheerios, 1 c fat-free milk, 5 slc chx bacon  Snk (10 AM)-  1 apple, diet soda L (12 PM)-  1 [place] sandwich, 1 cucumber, 1 oz bbq chips, 11 oz Premium protein drink, diet soda Snk (3 PM)-   Diet soda D (6 PM)-  Rotisserie chx, 2(+) c, asparagus, 2 c mashed potatoes Snk ( PM)-  2/3 c Carb-Smart ice cream Typical day? Yes.   Normal for Mon or Thur (Alexander Wilkerson days).  Tends to sleep late these days, missing lunch.    Nutritional Diagnosis: Progress noted on NI-5.8.2 Excessive carbohydrate intake as related to  meal components as evidenced by patient consistently limiting nightly ice cream portion to 2/3 cup.    Intervention: Reviewed diet and exercise history, and emphasized importance of limiting carb to keep BG lower.  Reminded Alexander Wilkerson that limiting carb vs. ever-increasing insulin doses will help with his weight management, and better weight control will lead to better BG ontrol.    Handouts given during visit include: After-Visit Summary (AVS)  Barriers to learning/adherence to lifestyle change: Lives alone, limited culinary skills.    Monitoring/Evaluation:  Dietary intake, exercise, FBG, and body weight in 4 week(s).

## 2023-04-19 NOTE — Patient Instructions (Signed)
ANY time you eat a food that contains carbohydrate (whether from sugar or from starch), make sure you read the total carb content, and figure out how much you are getting in the portion size you are eating.   Remember what one carb portion is.               - ONE slice of bread (or its equivalent, such as half of a hamburger bun).              - 1/2 cup of a "scoopable" starchy food such as potatoes or rice.              - 15 grams of Total Carbohydrate as shown on food label.     Goals for the next month:  1. Have dinner no later than 6:30 PM.       Set a phone alarm for 6 PM to remind you it's dinnertime.  Place your phone away from you so you have to get up to turn it off.   2. Go to bed no later than 10:30 PM including on weekends.       Set a phone alarm for 10:00 to remind you to get ready for bed.  Place your phone in another room so you have to get up to turn it off.   2. Record # of carb portions you have for dinner each night, aiming for no more than 2 portions per meal (and any additional carb from after-dinner snack if different from usual Carb-Smart ice cream). Remember that each 15 grams of carb counts as one portion.   When you have an unusually low or high blood sugar, immediately turn over your Goals Sheet, and write down what you last ate, what time it was, and how much you ate.     Follow-up: In-office appointment on Tuesday, June 11 at 4 PM.

## 2023-05-24 DIAGNOSIS — E113291 Type 2 diabetes mellitus with mild nonproliferative diabetic retinopathy without macular edema, right eye: Secondary | ICD-10-CM | POA: Diagnosis not present

## 2023-05-31 ENCOUNTER — Ambulatory Visit: Payer: BC Managed Care – PPO | Admitting: Family Medicine

## 2023-07-05 ENCOUNTER — Ambulatory Visit (INDEPENDENT_AMBULATORY_CARE_PROVIDER_SITE_OTHER): Payer: BC Managed Care – PPO | Admitting: Family Medicine

## 2023-07-05 VITALS — Ht 64.0 in | Wt 176.8 lb

## 2023-07-05 DIAGNOSIS — E109 Type 1 diabetes mellitus without complications: Secondary | ICD-10-CM

## 2023-07-05 NOTE — Patient Instructions (Signed)
ANY time you eat a food that contains carbohydrate (whether from sugar or from starch), make sure you read the total carb content, and figure out how much you are getting in the portion size you are eating.     Changes suggested today to limit your carb intake: 1. Remember what one carb portion is.               - ONE slice of bread (or its equivalent, such as half of a hamburger bun).              - 1/2 cup of a "scoopable" starchy food such as potatoes or rice.              - 15 grams of Total Carbohydrate as shown on food label.  (This means that to stay in your target range of carb at a meal, you get <30 grams of carb from your carb foods.) 2. Add up the carb's from each carb in your meal to determine if you need to decrease portion size or leave off one of the carb foods.  Example:  Yesterday's dinner included 1 1/2 cups of potatoes (= 3 carb portions, which is one greater than your target of 2 portions per meal).  Doubling your vegetables and cutting back the potatoes to 1 cup would get you right at your carb target.   Example #2: Yesterday's lunch included 2 slices low-carb bread (18 grams), 6-7 pita crackers (12 grams), and Jell-O pudding (10 grams) = a total of 50 grams instead of your target of 30 grams.  Replacing the pita crackers with a handful of carrots would lower the carb for you.  You may like to experiment with some low-carb wraps if you find any that are a bit lower in carb than the bread you're using.    Goals for the next month:  1. Have dinner no later than 6:30 PM.       Set a phone alarm for 6 PM to remind you it's dinnertime.  Place your phone away from you so you have to get up to turn it off.   2. Go to bed no later than 10:30 PM including on weekends.       Set a phone alarm for 10:00 to remind you to get ready for bed.  Place your phone in another room so you have to get up to turn it off.   2. Accurately determine the carbs in your meal, and record # of carb portions you  have for dinner each night, aiming for no more than 2 portions per meal (and any additional carb from after-dinner snack if different from usual Carb-Smart ice cream). Remember that each 15 grams of carb counts as one portion.    Call for a follow-up appointment on once you know your September schedule.  I will tentatively hold Sept 17 at 3:30 PM.

## 2023-07-05 NOTE — Progress Notes (Unsigned)
Medical Nutrition Therapy PCP None currently Alexander Gavel, MD retired)  Endocrinologist Alexander Frames, MD  Mom Alexander Wilkerson Appt start time: 1600 end time: 1700 (1 hour) Primary concerns today: Blood sugar control.  Relevant history/background: Alexander Wilkerson was referred by Alexander Frames, MD for MNT related to Type 1 diabetes.  He was diagnosed with DM1 ~2012, but hasn't seen an RD in several years.  Wants help in decreasing dietary carbohydrate and portion cntrl.  A1c was 9.5 on 05/20/21.  Other diagnoses include hypothyroidism, low HDL (39 on 05/20/21) and elevated TG (197 on 05/20/21).  Instructions provided by Dr. Talmage Wilkerson on 05/27/21 include:  Reduce saturated & trans fats, increase omega-3 fat sources and soluble fiber; exercise regularly; and limit alcohol intake Alexander Wilkerson does not drink alcohol).    Assessment:   Alexander Wilkerson again did a great job documenting progress on his goals as well as BG (fasting, pre-dinner, and weekly average) on his Goals Sheet.  He is still not determining carb content for most foods he doesn't normally eat.  Alexander Wilkerson has continued  60 to 90-min workouts (machines + free weights) at [employer], and plays pickle ball on Thursdays and Saturdays.   Last A1c was in March, 7.1% (down from 7.4% on 11/10/22).    Insulin (Novolog) dosing: 40 U in AM, 5 U at lunch, and if dinnertime BG is <120, 30 U; if >120, 40 U; if >150, 45 U. Weight  176.6 lb. (178.6 lb on 04/19/23; ht is 64".) Recent eating pattern: 3 meals and 2 snacks per day. FBG has ranged from 80s-170s (4 in 80s, 2 >180), averaging 170 last week (high-160's other weeks).   Recent physical activity:  [name] [facility]: Strength workout 45-60 min 2 X wk, +/- 20-30 min basketball; 60-90 min basketball &/or pickleball 2 X wk.   Progress the past 4 weeks: Behavioral goals  Meeting goal Dinner <6:30 PM:   Meeting goal 4-6 X wk. 11:30 PM bedtime:   Meeting goal 0-3 X wk (getting up at 5:30 AM M-F; ~8 AM on weekends) <2  carbs/dinner:   Variable; not always counting carb portions correctly.   24-hr recall:  (Up at 5:50 AM) B (6 AM)-  2 c Cheerios, 1 c fat-free Fairlife milk, 5 strips chx bacon Snk (9 AM)-  1 small apple, 6-7 pita crackers, diet flavored water L (11 PM)-  1 [place] sandwich, 1 baby cucumber, 6-7 pita crackers, sugar-free Jell-O pudding (10 g carb), diet soda Snk ( PM)-  diet flavored water D (6:45 PM)-  1 c rotisserie chx, sugar-free bbq sauce, 1 c green beans, 1 1/2 c instant mashed potatoes, diet soda Snk (9 PM)-  2/3 c sugar-free ice cream  Typical day? Yes.     Nutritional Diagnosis: Stable progress noted on NI-5.8.2 Excessive carbohydrate intake as related to meal components as evidenced by limiting carb portions about at about half his meals (and not usually extreme when >2).    Intervention: Reviewed diet and exercise history, and again emphasized importance of limiting carb to keep BG lower.    Handouts given during visit include: After-Visit Summary (AVS)  Barriers to learning/adherence to lifestyle change: Lives alone, limited culinary skills.    Monitoring/Evaluation:  Dietary intake, exercise, FBG, and body weight  TBA .  Infant will check his schedule and call for appt.

## 2023-07-12 NOTE — Telephone Encounter (Signed)
Refill request sent to MD.

## 2023-08-31 ENCOUNTER — Encounter (HOSPITAL_COMMUNITY): Payer: Self-pay | Admitting: Psychiatry

## 2023-08-31 ENCOUNTER — Telehealth (HOSPITAL_BASED_OUTPATIENT_CLINIC_OR_DEPARTMENT_OTHER): Payer: BC Managed Care – PPO | Admitting: Psychiatry

## 2023-08-31 VITALS — Wt 178.0 lb

## 2023-08-31 DIAGNOSIS — F411 Generalized anxiety disorder: Secondary | ICD-10-CM

## 2023-08-31 DIAGNOSIS — F900 Attention-deficit hyperactivity disorder, predominantly inattentive type: Secondary | ICD-10-CM | POA: Diagnosis not present

## 2023-08-31 MED ORDER — SERTRALINE HCL 50 MG PO TABS: 50.0000 mg | ORAL_TABLET | Freq: Every day | ORAL | 1 refills | Status: DC

## 2023-08-31 NOTE — Progress Notes (Signed)
[facility] MD Virtual Progress Note   Patient Location: Work Provider Location: Home Office  I connect with patient by telephone and verified that I am speaking with correct person by using two identifiers. I discussed the limitations of evaluation and management by telemedicine and the availability of in person appointments. I also discussed with the patient that there may be a patient responsible charge related to this service. The patient expressed understanding and agreed to proceed.  Alexander Wilkerson [id number] 32 y.o.  08/31/2023 2:50 PM  History of Present Illness:  Patient is evaluated by phone session.  He is taking Zoloft 50 mg daily.  He has appointment with his endocrinologist week from now to have hemoglobin A1c.  His last hemoglobin A1c was 7.3 and he admitted having some issues controlling his blood sugar.  He reported his anxiety is under control.  He is able to do multitasking and his attention and focus is good.  His energy level is good.  His appetite is okay.  He denies any crying spells or any feeling of hopelessness or worthlessness.  His weight is unchanged from the past.  He has no tremor or shakes or any EPS.  He like to keep the current dose of Zoloft.  He does not feel he need to go back on a stimulant.  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  He also does yard work.  Past Psychiatric History: H/O  ADHD.  In the past year tried Adderall.  On Concerta and Zoloft since 2006.  Concerta was discontinued in 2022.   Outpatient Encounter Medications as of 08/31/2023  Medication Sig   BD PEN NEEDLE NANO U/F 32G X 4 MM MISC U UTD BID   Glucagon (BAQSIMI TWO PACK) 3 MG/DOSE POWD See admin instructions. (Patient not taking: Reported on 02/03/2022)   levothyroxine (SYNTHROID) 50 MCG tablet Take 50 mcg by mouth every morning.   methylphenidate 36 MG PO CR tablet Take 1 tablet (36 mg total) by mouth every morning.  (Patient not taking: Reported on 11/30/2021)   NOVOLOG MIX 70/30 FLEXPEN (70-30) 100 UNIT/ML FlexPen    sertraline (ZOLOFT) 50 MG tablet Take 1 tablet (50 mg total) by mouth daily.   No facility-administered encounter medications on file as of 08/31/2023.    No results found for this or any previous visit (from the past 2160 hour(s)).   Psychiatric Specialty Exam: Physical Exam  Review of Systems  There were no vitals taken for this visit.There is no height or weight on file to calculate BMI.  General Appearance: NA  Eye Contact:  NA  Speech:  Clear and Coherent and Normal Rate  Volume:  Normal  Mood:  Euthymic  Affect:  NA  Thought Process:  Goal Directed  Orientation:  Full (Time, Place, and Person)  Thought Content:  WDL  Suicidal Thoughts:  No  Homicidal Thoughts:  No  Memory:  Immediate;   Good Recent;   Good Remote;   Good  Judgement:  Good  Insight:  Present  Psychomotor Activity:  Normal  Concentration:  Concentration: Good and Attention Span: Good  Recall:  Good  Fund of Knowledge:  Good  Language:  Good  Akathisia:  No  Handed:  Right  AIMS (if indicated):     Assets:  Communication Skills Desire for Improvement Housing Resilience Social Support Talents/Skills Transportation  ADL's:  Intact  Cognition:  WNL  Sleep:  ok  Assessment/Plan: Generalized anxiety disorder - Plan: sertraline (ZOLOFT) 50 MG tablet  Attention deficit hyperactivity disorder (ADHD), predominantly inattentive type - Plan: sertraline (ZOLOFT) 50 MG tablet  Patient is stable on his current medication.  I wish him good luck for upcoming blood work for hemoglobin A1c.  Continue Zoloft 50 mg it is helping his attention, focus, anxiety.  Recommended to call us back with any question or any concern.  Follow-up in 6 months   Follow Up Instructions:     I discussed the assessment and treatment plan with the patient. The patient was provided an opportunity to ask questions and all  were answered. The patient agreed with the plan and demonstrated an understanding of the instructions.   The patient was advised to call back or seek an in-person evaluation if the symptoms worsen or if the condition fails to improve as anticipated.    Collaboration of Care: Other provider involved in patient's care AEB notes are available in epic to review  Patient/Guardian was advised Release of Information must be obtained prior to any record release in order to collaborate their care with an outside provider. Patient/Guardian was advised if they have not already done so to contact the registration department to sign all necessary forms in order for [place] to release information regarding their care.   Consent: Patient/Guardian gives verbal consent for treatment and assignment of benefits for services provided during this visit. Patient/Guardian expressed understanding and agreed to proceed.     I provided 18 minutes of non face to face time during this encounter.  Note: This document was prepared by [employer] voice dictation technology and any errors that results from this process are unintentional.    Cleotis Nipper, MD 08/31/2023

## 2023-09-01 DIAGNOSIS — E1065 Type 1 diabetes mellitus with hyperglycemia: Secondary | ICD-10-CM | POA: Diagnosis not present

## 2023-09-01 DIAGNOSIS — E02 Subclinical iodine-deficiency hypothyroidism: Secondary | ICD-10-CM | POA: Diagnosis not present

## 2023-09-01 DIAGNOSIS — E781 Pure hyperglyceridemia: Secondary | ICD-10-CM | POA: Diagnosis not present

## 2023-09-06 ENCOUNTER — Ambulatory Visit (INDEPENDENT_AMBULATORY_CARE_PROVIDER_SITE_OTHER): Payer: BC Managed Care – PPO | Admitting: Family Medicine

## 2023-09-06 DIAGNOSIS — E109 Type 1 diabetes mellitus without complications: Secondary | ICD-10-CM

## 2023-09-06 NOTE — Patient Instructions (Signed)
ANY time you eat a food that contains carbohydrate (whether from sugar or from starch), make sure you read the total carb content, and figure out how much you are getting in the portion size you are eating.  If there is no nutrition label, you can find the nutrition info for many of these foods online.     Instant potatoes:  1/4 cup dry = 17 grams total carb (~1 portion) or 1 pouch = 20 grams of carb.    Reiterated from last appt: 1. Remember what one carb portion is.               - ONE slice of bread (or its equivalent, such as half of a hamburger bun).              - 1/2 cup of a "scoopable" starchy food such as potatoes or rice.              - 15 grams of Total Carbohydrate as shown on food label.  (This means that to stay in your target range of carb at a meal, you get <30 grams of carb from your carb foods.) 2. Add up the carb's from each carb in your meal to determine if you need to decrease portion size or leave off one of the carb foods.  Example:  Yesterday's dinner included 1 1/2 cups of potatoes (= 3 carb portions, which is one greater than your target of 2 portions per meal).  Doubling your vegetables and cutting back the potatoes to 1 cup would get you right at your carb target.   Example #2: Yesterday's lunch included 2 slices low-carb bread (18 grams), 6-7 pita crackers (12 grams), and Jell-O pudding (10 grams) = a total of 50 grams instead of your target of 30 grams.  Replacing the pita crackers with a handful of carrots would lower the carb for you.  You may like to experiment with some low-carb wraps if you find any that are a bit lower in carb than the bread you're using.    Behavioral Goals:  1. Have dinner no later than 6:30 PM.       Set a phone alarm for 6 PM to remind you it's dinnertime.  Place your phone away from you so you have to get up to turn it off.   2. Go to bed no later than 11:00 PM including on weekends.       Set a phone alarm for 10:30 to remind you to get ready  for bed and a 10:40  alarm for back-up.  Place your phone in another room so you have to get up to turn it off.   2. Accurately determine the carbs in your dinner, and record # of carb portions you have for dinner each night, aiming for no more than 2 portions per meal (and any additional carb from after-dinner snack if different from usual Carb-Smart ice cream). Remember that each 15 grams of carb counts as one portion. - Dedicate a page in the back of your notebook to recording carb amounts for specific portion sizes of any foods you eat.    Follow-up appt on Tuesday, Oct 22 at 3:30 PM.

## 2023-09-06 NOTE — Progress Notes (Signed)
Medical Nutrition Therapy PCP None currently Alexander Gavel, MD retired)  Endocrinologist Alexander Frames, MD  Mom Alexander Wilkerson Appt start time: 1600 end time: 1700 (1 hour) Primary concerns today: Blood sugar control.  Relevant history/background: Alexander Wilkerson was referred by Alexander Frames, MD for MNT related to Type 1 diabetes.  He was diagnosed with DM1 ~2012, but hasn't seen an RD in several years.  Wants help in decreasing dietary carbohydrate and portion cntrl.  A1c was 9.5 on 05/20/21.  Other diagnoses include hypothyroidism, low HDL (39 on 05/20/21) and elevated TG (197 on 05/20/21).    Assessment:   Alexander Wilkerson has been setting an alarm to remind him of bedtimes, but tends to turn it off and ignore it.  He recently fell asleep in front of the TV; clearly not getting enough sleep.  Dinner time is also consistently later than goal.   09/01/23 Labs at Dr. Willeen Wilkerson office:  - A1c 7.4%  (7.1% in March 2024; 7.4% on 11/10/22).   - TC 74 - TG 94 - HDL 40 - LDL 89  Insulin (Novolog) dosing: 40 U in AM, none at lunch, and if dinnertime BG is <120, 30 U; if >120, 40 U; if >150, 45 U. Weight  177.8 lb. (176.6 lb on 07/05/23; ht is 64".) Recent eating pattern: 3 meals and 2 snacks per day. FBG has ranged from 150s-170s, averaging 174 last week, with a low of 83, which Alexander Wilkerson thinks was the morning after he ate pumpkin instead of potato with his dinner.    Recent physical activity:  [facility]: Strength workout 45-60 min Mon & Thur, 60-90 min pickleball 2 X wk (Tues PM and Sat mornings).    Progress the past 4 weeks: Behavioral goals  Meeting goal Dinner <6:30 PM:   Meeting goal 2-4 X wk.  Latest meals were 7:45 PM.  Mon&Thur gets home from ex 5:30-6 PM.   10:30 PM bedtime:   Not meeting goal any days (getting up at 6:30 AM M-F; 8 or 9 AM on weekends) Record carb portions:  3-4; not always counting carb portions correctly.   24-hr recall:  (Up at 6:45 AM; FBG was 99) B (7:15 AM)-    2 c  Cheerios, 1 c fat-free Fairlife milk, 5 strips chx bacon Snk (9 AM)-    1 small apple, ~1 1/2 c [employer], diet flavored water L (12 PM)-       1 [place] sandwich, 1 baby cucumber, 2/3 c carrot chips, ~1 c [employer], sugar-free Jell-O pudding (10 g carb), diet soda Snk (3 PM)-     12 oz diet soda D (7 PM)-    1 c rotisserie chx, sugar-free bbq sauce, 1 c roasted broc&carrots, 1 1/2 c instant mashed potatoes, diet soda Snk (9 PM)-     2/3 c Carb-Smart ice cream  Typical day? Yes.    Nutritional Diagnosis: Little progress noted on NI-5.8.2 Excessive carbohydrate intake as related to meal components as evidenced by not determining carb content of most foods consumed (and slightly increased A1c since March 2024).    Intervention: Reviewed diet and exercise history, and congratulated patient on excellent record keeping and exercise routine, but again emphasized importance of limiting carb to keep BG lower.    Handouts given during visit include: After-Visit Summary (AVS)  Barriers to learning/adherence to lifestyle change: Lives alone, limited culinary skills.    Monitoring/Evaluation:  Dietary intake, exercise, FBG, and body weight in  5 weeks .

## 2023-09-08 DIAGNOSIS — E781 Pure hyperglyceridemia: Secondary | ICD-10-CM | POA: Diagnosis not present

## 2023-09-08 DIAGNOSIS — E1065 Type 1 diabetes mellitus with hyperglycemia: Secondary | ICD-10-CM | POA: Diagnosis not present

## 2023-09-08 DIAGNOSIS — E02 Subclinical iodine-deficiency hypothyroidism: Secondary | ICD-10-CM | POA: Diagnosis not present

## 2023-10-11 ENCOUNTER — Ambulatory Visit: Payer: BC Managed Care – PPO | Admitting: Family Medicine

## 2023-10-11 VITALS — Ht 64.0 in | Wt 177.6 lb

## 2023-10-11 DIAGNOSIS — E109 Type 1 diabetes mellitus without complications: Secondary | ICD-10-CM

## 2023-10-11 NOTE — Patient Instructions (Addendum)
Call Dr. Willeen Cass office to schedule an appt to learn more about the Dexcom 7 use, and how to use the app as well as to have Dr. Talmage Nap review your data, and adjust insulin as needed.    Reiterated from last appt: ANY time you eat a food that contains carbohydrate (whether from sugar or from starch), make sure you read the total carb content, and figure out how much you are getting in the portion size you are eating.  If there is no nutrition label, you can find the nutrition info for many of these foods online.    1. Remember what one carb portion is.               - ONE slice of bread (or its equivalent, such as half of a hamburger bun).              - 1/2 cup of a "scoopable" starchy food such as potatoes or rice.              - 15 grams of Total Carbohydrate as shown on food label.  (This means that to stay in your target range of carb at a meal, you get <30 grams of carb from your carb foods.) 2. Add up the carb's from each carb in your meal to determine if you need to decrease portion size or leave off one of the carb foods.   Breakfast: 2 cups of Cheerios = 40 grams of carb.  This means 1 1.2 cups would be better for your blood sugar control.   Lunch: Check the carb content on your low-carb bread.  Check also the carb content of any other possible carb foods such as Jell-O pudding (10 g) and chips or popcorn.  Aim for no more than 2 carb portions (= 30 grams total).   Behavioral Goals:  1. Have dinner no later than 6:30 PM.       Set a phone alarm for 6 PM to remind you it's dinnertime.  Place your phone away from you so you have to get up to turn it off.   2. Go to bed no later than 11:00 PM including on weekends.       Set a phone alarm for 10:30 to remind you to get ready for bed and a 10:40  alarm for back-up.  Place your phone in another room so you have to get up to turn it off.   2. Accurately determine the carbs in your dinner, and record # of carb portions you have for dinner each  night, aiming for no more than 2 portions per meal (and any additional carb from after-dinner snack if different from usual Carb-Smart ice cream). Remember that each 15 grams of carb counts as one portion. - Dedicate a page in the back of your notebook to recording carb amounts for specific portion sizes of any foods you eat.     When you come to you follow-up appt on Tues, Nov 19 at 3:30 PM, bring a list of foods you eat most days of the week and the carb content of a serving size you usually eat.

## 2023-10-11 NOTE — Progress Notes (Signed)
Medical Nutrition Therapy PCP None currently Alexander Gavel, MD retired)  Endocrinologist Dorisann Frames, MD  Mom Georgetta Haber Appt start time: 1600 end time: 1700 (1 hour) Primary concerns today: Blood sugar control.  Relevant history/background: Zymarion was referred by Dorisann Frames, MD for MNT related to Type 1 diabetes.  He was diagnosed with DM1 ~2012, but hasn't seen an RD in several years.  Wants help in decreasing dietary carbohydrate and portion cntrl.  A1c was 9.5 on 05/20/21.  Other diagnoses include hypothyroidism, low HDL (39 on 05/20/21) and elevated TG (197 on 05/20/21).    Assessment:   Emryk has started using Dexcom G7 CGM to monitor blood glucose levels.  He has not learned all the details of using the device, so did not know how to access all data, and was essentially guessing on the FBG added to his Goals Sheet.    09/01/23 Labs at Dr. Willeen Cass office:  - A1c 7.4%  (7.1% in March 2024; 7.4% on 11/10/22).   - TC 74 - TG 94 - HDL 40 - LDL 89  Insulin (Novolog) dosing: 40 U Novolog 70/30 in AM, none at lunch, and if dinnertime BG is <120, 30 U; if >120, 40 U; if >150, 45 U. Weight  177.6 lb. (177.8 lb on 09/06/23; ht is 64".) Recent eating pattern: 3 meals and 2 snacks per day. CGM data:  Past 1 week 36% In Range;  Past 2 weeks 43% In Range; 6% Low, 22% High, 27% Very High, 2% Very Low;  Past 4 weeks 45% In Range.   Recent physical activity:  [name] [facility]: Strength workout 45-60 min Mon & Thur, 60-90 min pickleball 2 X wk (Tues PM and Sat mornings).    Progress the past 4 weeks: Behavioral goals  Meeting goal Dinner <6:30 PM:   Meeting goal 2-4 X wk.  Most are between 5:30 and 7:45.  (Mon&Thur gets home from ex 5:30-6 PM.)   11:00 PM bedtime:   Not getting to bed until >11:30 PM, some after 12 AM (getting up at 7 AM M-F; 8 or 9 AM on weekends) Record carb portions:  Recording # of portions, but seldom actually verifying carb content.   24-hr recall:  (Up at  6:45 AM) B (7 AM)-    2 c Cheerios, 1 c fat-free Fairlife milk, 5 strips chx bacon (BG peaked at 290 around 9:30 AM) Snk (9 AM)-    1 small apple, 1 c corn chips, diet soda L (12 PM)-       1 [place] sandwich, 1 baby cucumber, 2/3 c carrot chips, 1 c corn chips, sugar-free Jell-O pudding (10 g carb), diet soda Snk (3 PM)-     12 oz diet soda D (7:30 PM)-   2+ c chx pot pie, 1+ c green beans, diet soda Snk (9 PM)-    2/3 c Carb-Smart ice cream  Typical day? Yes.     Nutritional Diagnosis: Little progress noted on NI-5.8.2 Excessive carbohydrate intake as related to meal components as evidenced by not determining carb content of most foods consumed (and slightly increased A1c since March 2024).    Intervention: Reviewed diet and exercise history, and congratulated patient on excellent record keeping and exercise routine, but again emphasized importance of limiting carb to keep BG lower.    Handouts given during visit include: After-Visit Summary (AVS)  Barriers to learning/adherence to lifestyle change: Lives alone, limited culinary skills.    Monitoring/Evaluation:  Dietary intake, exercise, FBG, and body  weight in  4 weeks .

## 2023-10-31 DIAGNOSIS — E781 Pure hyperglyceridemia: Secondary | ICD-10-CM | POA: Diagnosis not present

## 2023-10-31 DIAGNOSIS — E02 Subclinical iodine-deficiency hypothyroidism: Secondary | ICD-10-CM | POA: Diagnosis not present

## 2023-10-31 DIAGNOSIS — E1065 Type 1 diabetes mellitus with hyperglycemia: Secondary | ICD-10-CM | POA: Diagnosis not present

## 2023-11-08 ENCOUNTER — Ambulatory Visit (INDEPENDENT_AMBULATORY_CARE_PROVIDER_SITE_OTHER): Payer: BC Managed Care – PPO | Admitting: Family Medicine

## 2023-11-08 VITALS — Ht 64.0 in

## 2023-11-08 DIAGNOSIS — E109 Type 1 diabetes mellitus without complications: Secondary | ICD-10-CM

## 2023-11-08 NOTE — Patient Instructions (Addendum)
Reminder:  It is best to control your blood sugar as well as you can through your diet because this allows you to use less insulin.  The advantage of using less insulin is that it will probably be easier to lose weight.    Until you get your pump and pump training, let's plan on limiting carb intake to 50 grams of carbohydrate per meal.  One alternative to the instant mashed potatoes is a baked [place] potato: 1 6-ounce potato provides 31 grams of carb.  (A 6-ounce potato will be <0.4 of a pound on the grocery store scale.)   Carb counting:  Online we found: [employer] has 4 servings per 4-oz pouch; each serving = 19 g carb; 1 whole pouch = 19 X 4 = 76 g.  Half of this = 38 g of carb.    Remember how to get a rough estimate of one carb portion:               - ONE slice of bread (or its equivalent, such as half of a hamburger bun).              - 1/2 cup of a "scoopable" starchy food such as potatoes or rice.              - 15 grams of Total Carbohydrate as shown on food label.     Behavioral Goals:  1. Have dinner no later than 6:30 PM.       - Set a phone alarm for 6 PM to remind you it's dinnertime.  Place your phone away from you so you have to get up to turn it off.       - On Monday & Thursdays, be sure to start making dinner as soon as you get home.    2. Go to bed no later than 11:00 PM including on weekends.       - Set a phone alarm for 10:30 to remind you to get ready for bed and a 10:40  alarm for back-up.  Place your phone across the room so you have to get up to turn it off.       - Value of getting enough sleep: Inadequate sleep is associated with poorer blood sugar management as well as with more difficulty in managing weight.    2. Continue to determine the carbs in your meals, recording the # of grams.  Be sure to bring your records to your pump training session at Dr. Willeen Cass office, including your Goals Sheet.    Document progress on  your revised goals sheet.  Ask your mom to email me ([email or web address]), and I will email a copy of your goals sheet to be printed out or modified as needed.    Follow-up as needed: [place], engineering at [facility], [address] (4th floor).  They will call you to schedule an appt after receiving a physician referral.

## 2023-11-08 NOTE — Progress Notes (Signed)
Medical Nutrition Therapy PCP None currently Alexander Gavel, MD retired)  Endocrinologist Alexander Frames, MD  Mom Alexander Wilkerson Appt start time: 1600 end time: 1700 (1 hour) Primary concerns today: Blood sugar control.  Relevant history/background: Alexander Wilkerson was referred by Alexander Frames, MD for MNT related to Type 1 diabetes.  He was diagnosed with DM1 ~2012, but hasn't seen an RD in several years.  Wants help in decreasing dietary carbohydrate and portion cntrl.  A1c was 9.5 on 05/20/21.  Other diagnoses include hypothyroidism, low HDL (39 on 05/20/21) and elevated TG (197 on 05/20/21).    Assessment:   Alexander Wilkerson saw Dr. Talmage Wilkerson again last week.  After reviewing his CGM data, she recommended getting him set up with a pump.  He is waiting to get an appt with for pump training.  With help from his mom, Alexander Wilkerson now has a list of carb content of foods he eats frequently, which he brought in today.  He has been diligently recording carb content of meals in his notebook.     09/01/23 Labs at Dr. Willeen Wilkerson office:  - A1c 7.4%  (7.1% in March 2024; 7.4% on 11/10/22).   - TC 74 - TG 94 - HDL 40 - LDL 89  Insulin dosing: 40 U Novolog 70/30 in AM, none at lunch; if dinnertime BG is <120, 30 U; if 120-150, 40 U; if >150, 45 U. Weight  178.0 lb. (177.6  lb on 10/11/23; ht is 64".) Recent eating pattern: 3 meals and 2 snacks per day. CGM data: Average BG last 30 days: 201.  Past 1 week 65% In Range;  Past 2 weeks 50% In Range; 5% Low, 20% High, 23% Very High, 2% Very Low;  Past 30 days 41% In Range.   Recent physical activity:  [name] [facility]: Strength workout 45-60 min Mon & Thur, 60-90 min pickleball 2 X wk (Tues PM and Sat mornings).  Has walked sporadically.    Progress the past 4 weeks: Behavioral goals  Meeting goal Dinner <6:30 PM:   Meeting goal 3-4 X wk. 11:00 PM bedtime:   Bedtime 11:30 or 11:45 PM most nights; getting up at 7 AM M-F; 8 or 9 AM on weekends Record carb portions:  Recording # of  portions; verifying carb content for each meal.   24-hr recall:  (Up at 7 AM) B (7 AM)-    1 1/2 c Cheerios, 1 c fat-free Fairlife milk, 5 strips chx bacon (BG peaked at 290 around 9:30 AM)   39 g CHO Snk (10 AM)-  1 small apple, 1 bag pork rinds, diet soda         15 g CHO L (12 PM)-       1 turk sand, 1 baby cucumber, 2/3 c carrot chips, 1 oz skinny pop p'corn, sugar-free Jell-O pudding,diet soda  46 g CHO Snk (3 PM)-     12 oz diet soda D (6:40 PM)-   1 c rotis chx, 1 c green beans, 1 c broc&carrots, 1/2 pouch mashed pot's, 1-2 tbsp lite margarine, diet soda 55 g CHO Snk (8 PM)-     2/3 c Carb-Smart ice cream             16 g CHO Typical day? Yes.     Nutritional Diagnosis: Some progress noted on NI-5.8.2 Excessive carbohydrate intake as related to meal components as evidenced by verifying carb content of most foods consumed.   Intervention: Reviewed diet and exercise history, again congratulated patient on excellent  record keeping and exercise routine, but and emphasized importance of getting adequate sleep in helping him maintain better weight and glycemic control.    Handouts given during visit include: After-Visit Summary (AVS)  Barriers to learning/adherence to lifestyle change: Lives alone, limited culinary skills.    Monitoring/Evaluation as needed: Dietary intake, exercise, CGM data, and body weight in  at [facility] following my retirement .

## 2023-12-08 ENCOUNTER — Other Ambulatory Visit (HOSPITAL_COMMUNITY): Payer: Self-pay | Admitting: Psychiatry

## 2023-12-08 DIAGNOSIS — F411 Generalized anxiety disorder: Secondary | ICD-10-CM

## 2023-12-08 DIAGNOSIS — F900 Attention-deficit hyperactivity disorder, predominantly inattentive type: Secondary | ICD-10-CM

## 2024-01-03 DIAGNOSIS — Z4681 Encounter for fitting and adjustment of insulin pump: Secondary | ICD-10-CM | POA: Diagnosis not present

## 2024-01-03 DIAGNOSIS — E781 Pure hyperglyceridemia: Secondary | ICD-10-CM | POA: Diagnosis not present

## 2024-01-03 DIAGNOSIS — E02 Subclinical iodine-deficiency hypothyroidism: Secondary | ICD-10-CM | POA: Diagnosis not present

## 2024-01-03 DIAGNOSIS — E1065 Type 1 diabetes mellitus with hyperglycemia: Secondary | ICD-10-CM | POA: Diagnosis not present

## 2024-02-20 DIAGNOSIS — E1065 Type 1 diabetes mellitus with hyperglycemia: Secondary | ICD-10-CM | POA: Diagnosis not present

## 2024-02-20 DIAGNOSIS — Z4681 Encounter for fitting and adjustment of insulin pump: Secondary | ICD-10-CM | POA: Diagnosis not present

## 2024-02-20 DIAGNOSIS — E02 Subclinical iodine-deficiency hypothyroidism: Secondary | ICD-10-CM | POA: Diagnosis not present

## 2024-02-27 ENCOUNTER — Encounter (HOSPITAL_COMMUNITY): Payer: Self-pay | Admitting: Psychiatry

## 2024-02-27 ENCOUNTER — Telehealth (HOSPITAL_BASED_OUTPATIENT_CLINIC_OR_DEPARTMENT_OTHER): Payer: BC Managed Care – PPO | Admitting: Psychiatry

## 2024-02-27 VITALS — Wt 174.0 lb

## 2024-02-27 DIAGNOSIS — F411 Generalized anxiety disorder: Secondary | ICD-10-CM | POA: Diagnosis not present

## 2024-02-27 DIAGNOSIS — F900 Attention-deficit hyperactivity disorder, predominantly inattentive type: Secondary | ICD-10-CM | POA: Diagnosis not present

## 2024-02-27 MED ORDER — SERTRALINE HCL 50 MG PO TABS: 50.0000 mg | ORAL_TABLET | Freq: Every day | ORAL | 1 refills | Status: DC

## 2024-02-27 NOTE — Progress Notes (Signed)
 [facility] MD Virtual Progress Note   Patient Location: Home Provider Location: Home Office  I connect with patient by telephone and verified that I am speaking with correct person by using two identifiers. I discussed the limitations of evaluation and management by telemedicine and the availability of in person appointments. I also discussed with the patient that there may be a patient responsible charge related to this service. The patient expressed understanding and agreed to proceed.  Alexander Wilkerson [id number] 33 y.o.  02/27/2024 2:42 PM  History of Present Illness:  Patient is evaluated by phone session.  He is taking Zoloft 50 mg daily which is helping his anxiety.  He denies any panic attack, crying spells or any feeling of hopelessness or worthlessness.  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  Denies any suicidal thoughts.  He has appointment with his endocrinologist in September and his hemoglobin A1c 7.1.  His appetite is okay.  He sleeps good.  Denies level is good.  His attention concentration is good and he does not feel he need to go back on Concerta which he discontinued in 2022.  Like to continue Zoloft at present dose.  Past Psychiatric History: H/O  ADHD.  In the past year tried Adderall.  On Concerta and Zoloft since 2006.  Concerta was discontinued in 2022 after feeling better. .    Outpatient Encounter Medications as of 02/27/2024  Medication Sig   BD PEN NEEDLE NANO U/F 32G X 4 MM MISC U UTD BID   Glucagon (BAQSIMI TWO PACK) 3 MG/DOSE POWD See admin instructions. (Patient not taking: Reported on 02/03/2022)   levothyroxine (SYNTHROID) 50 MCG tablet Take 50 mcg by mouth every morning.   methylphenidate 36 MG PO CR tablet Take 1 tablet (36 mg total) by mouth every morning. (Patient not taking: Reported on 11/30/2021)   NOVOLOG MIX 70/30 FLEXPEN (70-30) 100 UNIT/ML FlexPen    sertraline  (ZOLOFT) 50 MG tablet Take 1 tablet (50 mg total) by mouth daily.   No facility-administered encounter medications on file as of 02/27/2024.    No results found for this or any previous visit (from the past 2160 hours).   Psychiatric Specialty Exam: Physical Exam  Review of Systems  Weight 174 lb (78.9 kg).There is no height or weight on file to calculate BMI.  General Appearance: NA  Eye Contact:  NA  Speech:  Slow  Volume:  Decreased  Mood:  Euthymic  Affect:  Congruent  Thought Process:  Goal Directed  Orientation:  Full (Time, Place, and Person)  Thought Content:  Logical  Suicidal Thoughts:  No  Homicidal Thoughts:  No  Memory:  Immediate;   Good Recent;   Good Remote;   Good  Judgement:  Good  Insight:  Present  Psychomotor Activity:  Normal  Concentration:  Concentration: Good and Attention Span: Good  Recall:  Good  Fund of Knowledge:  Good  Language:  Good  Akathisia:  No  Handed:  Right  AIMS (if indicated):     Assets:  Communication Skills Desire for Improvement Housing Transportation  ADL's:  Intact  Cognition:  WNL  Sleep:  ok     Assessment/Plan: Generalized anxiety disorder - Plan: sertraline (ZOLOFT) 50 MG tablet  Attention deficit hyperactivity disorder (ADHD), predominantly inattentive type - Plan: sertraline (ZOLOFT) 50 MG tablet  Patient stable on Zoloft 50 mg daily.  Continue present dose.  Does not  need stimulant or any medication to address ADHD symptoms as symptoms are stable.  Follow-up in 6 months.   Follow Up Instructions:     I discussed the assessment and treatment plan with the patient. The patient was provided an opportunity to ask questions and all were answered. The patient agreed with the plan and demonstrated an understanding of the instructions.   The patient was advised to call back or seek an in-person evaluation if the symptoms worsen or if the condition fails to improve as anticipated.    Collaboration of Care:  Other provider involved in patient's care AEB notes are available in epic to review  Patient/Guardian was advised Release of Information must be obtained prior to any record release in order to collaborate their care with an outside provider. Patient/Guardian was advised if they have not already done so to contact the registration department to sign all necessary forms in order for [place] to release information regarding their care.   Consent: Patient/Guardian gives verbal consent for treatment and assignment of benefits for services provided during this visit. Patient/Guardian expressed understanding and agreed to proceed.     I provided 17 minutes of non face to face time during this encounter.  Note: This document was prepared by [employer] voice dictation technology and any errors that results from this process are unintentional.    Cleotis Nipper, MD 02/27/2024

## 2024-02-28 ENCOUNTER — Telehealth (HOSPITAL_COMMUNITY): Payer: BC Managed Care – PPO | Admitting: Psychiatry

## 2024-03-07 DIAGNOSIS — E1065 Type 1 diabetes mellitus with hyperglycemia: Secondary | ICD-10-CM | POA: Diagnosis not present

## 2024-03-07 DIAGNOSIS — E02 Subclinical iodine-deficiency hypothyroidism: Secondary | ICD-10-CM | POA: Diagnosis not present

## 2024-03-07 DIAGNOSIS — E781 Pure hyperglyceridemia: Secondary | ICD-10-CM | POA: Diagnosis not present

## 2024-03-19 DIAGNOSIS — E781 Pure hyperglyceridemia: Secondary | ICD-10-CM | POA: Diagnosis not present

## 2024-03-19 DIAGNOSIS — E02 Subclinical iodine-deficiency hypothyroidism: Secondary | ICD-10-CM | POA: Diagnosis not present

## 2024-03-19 DIAGNOSIS — E1065 Type 1 diabetes mellitus with hyperglycemia: Secondary | ICD-10-CM | POA: Diagnosis not present

## 2024-03-19 DIAGNOSIS — Z4681 Encounter for fitting and adjustment of insulin pump: Secondary | ICD-10-CM | POA: Diagnosis not present

## 2024-04-09 DIAGNOSIS — E109 Type 1 diabetes mellitus without complications: Secondary | ICD-10-CM | POA: Diagnosis not present

## 2024-05-28 ENCOUNTER — Telehealth (HOSPITAL_BASED_OUTPATIENT_CLINIC_OR_DEPARTMENT_OTHER): Admitting: Psychiatry

## 2024-05-28 ENCOUNTER — Encounter (HOSPITAL_COMMUNITY): Payer: Self-pay | Admitting: Psychiatry

## 2024-05-28 DIAGNOSIS — F900 Attention-deficit hyperactivity disorder, predominantly inattentive type: Secondary | ICD-10-CM

## 2024-05-28 DIAGNOSIS — F411 Generalized anxiety disorder: Secondary | ICD-10-CM

## 2024-05-28 MED ORDER — SERTRALINE HCL 50 MG PO TABS: 50.0000 mg | ORAL_TABLET | Freq: Every day | ORAL | 1 refills | Status: DC

## 2024-05-28 NOTE — Progress Notes (Signed)
 [facility] MD Virtual Progress Note   Patient Location: Home Provider Location: Home Office  I connect with patient by telephone and verified that I am speaking with correct person by using two identifiers. I discussed the limitations of evaluation and management by telemedicine and the availability of in person appointments. I also discussed with the patient that there may be a patient responsible charge related to this service. The patient expressed understanding and agreed to proceed.  Alexander Wilkerson [id number] 33 y.o.  05/28/2024 11:03 AM  History of Present Illness:  Patient is evaluated by phone session.  He cannot do video session but promised to have in person visit next time.  He reported things are going well.  He denies any crying spells, feeling of hopelessness or worthlessness.  He denies any anhedonia.  He sleeps is good.  He now has a new primary care Dr. Margarete Sharps at [facility]  He is taking all his medication as prescribed.  He is on a insulin pump and his diabetes and thyroid  issues are managed by endocrinologist.  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  He sleeps good.  Denies any irritability, anger, mania.  Denies any major panic attack.  He reported his attention concentration is good and is able to finish his job on time.  Patient used to take Concerta  which was discontinued in 2022.  Past Psychiatric History: H/O  ADHD.  In the past year tried Adderall.  On Concerta  and Zoloft  since 2006.  Concerta  was discontinued in 2022 after feeling better. .     Outpatient Encounter Medications as of 05/28/2024  Medication Sig   BD PEN NEEDLE NANO U/F 32G X 4 MM MISC U UTD BID   Glucagon (BAQSIMI TWO PACK) 3 MG/DOSE POWD See admin instructions. (Patient not taking: Reported on 02/03/2022)   levothyroxine (SYNTHROID) 50 MCG tablet Take 50 mcg by mouth every morning.   methylphenidate  36 MG PO CR tablet Take 1  tablet (36 mg total) by mouth every morning. (Patient not taking: Reported on 11/30/2021)   NOVOLOG MIX 70/30 FLEXPEN (70-30) 100 UNIT/ML FlexPen    sertraline  (ZOLOFT ) 50 MG tablet Take 1 tablet (50 mg total) by mouth daily.   No facility-administered encounter medications on file as of 05/28/2024.    No results found for this or any previous visit (from the past 2160 hours).   Psychiatric Specialty Exam: Physical Exam  Review of Systems  Weight 174 lb (78.9 kg).There is no height or weight on file to calculate BMI.  General Appearance: NA  Eye Contact:  NA  Speech:  Slow  Volume:  Decreased  Mood:  Euthymic  Affect:  NA  Thought Process:  Goal Directed  Orientation:  NA  Thought Content:  Logical  Suicidal Thoughts:  No  Homicidal Thoughts:  No  Memory:  Immediate;   Good Recent;   Good Remote;   Good  Judgement:  Intact  Insight:  Present  Psychomotor Activity:  Normal  Concentration:  Concentration: Good and Attention Span: Good  Recall:  Good  Fund of Knowledge:  Good  Language:  Good  Akathisia:  No  Handed:  Right  AIMS (if indicated):     Assets:  Communication Skills Desire for Improvement Housing Social Support Talents/Skills Transportation  ADL's:  Intact  Cognition:  WNL  Sleep:  ok       01/27/2016    4:25 PM  Depression  screen PHQ 2/9  Decreased Interest 0  Down, Depressed, Hopeless 0  PHQ - 2 Score 0    Assessment/Plan: Generalized anxiety disorder - Plan: sertraline  (ZOLOFT ) 50 MG tablet  Attention deficit hyperactivity disorder (ADHD), predominantly inattentive type - Plan: sertraline  (ZOLOFT ) 50 MG tablet  Patient is stable on current dose of Zoloft  50 mg.  His attention concentration is good.  He has no issue at work.  He does not feel he needed stimulants since his ADHD symptoms are controlled.  Recommend to call us  back if is any question or any concern.  Follow-up in 6 months.  Encouraged to keep appointment with his primary care.  He is  now on OmniPod and his blood sugar is stable.   Follow Up Instructions:     I discussed the assessment and treatment plan with the patient. The patient was provided an opportunity to ask questions and all were answered. The patient agreed with the plan and demonstrated an understanding of the instructions.   The patient was advised to call back or seek an in-person evaluation if the symptoms worsen or if the condition fails to improve as anticipated.    Collaboration of Care: Other provider involved in patient's care AEB notes are available in epic to review  Patient/Guardian was advised Release of Information must be obtained prior to any record release in order to collaborate their care with an outside provider. Patient/Guardian was advised if they have not already done so to contact the registration department to sign all necessary forms in order for us  to release information regarding their care.   Consent: Patient/Guardian gives verbal consent for treatment and assignment of benefits for services provided during this visit. Patient/Guardian expressed understanding and agreed to proceed.     Total encounter time 16 minutes which includes face-to-face time, chart reviewed, care coordination, order entry and documentation during this encounter.   Note: This document was prepared by [employer] voice dictation technology and any errors that results from this process are unintentional.    Arturo Late, MD 05/28/2024

## 2024-07-30 DIAGNOSIS — Z4681 Encounter for fitting and adjustment of insulin pump: Secondary | ICD-10-CM | POA: Diagnosis not present

## 2024-07-30 DIAGNOSIS — E02 Subclinical iodine-deficiency hypothyroidism: Secondary | ICD-10-CM | POA: Diagnosis not present

## 2024-07-30 DIAGNOSIS — E1065 Type 1 diabetes mellitus with hyperglycemia: Secondary | ICD-10-CM | POA: Diagnosis not present

## 2024-07-30 DIAGNOSIS — E781 Pure hyperglyceridemia: Secondary | ICD-10-CM | POA: Diagnosis not present

## 2024-10-06 ENCOUNTER — Other Ambulatory Visit (HOSPITAL_BASED_OUTPATIENT_CLINIC_OR_DEPARTMENT_OTHER): Payer: Self-pay

## 2024-10-06 MED ORDER — COMIRNATY 30 MCG/0.3ML IM SUSY
0.3000 mL | PREFILLED_SYRINGE | Freq: Once | INTRAMUSCULAR | 0 refills | Status: AC
  Filled 2024-10-06: qty 0.3, 1d supply, fill #0

## 2024-11-06 DIAGNOSIS — E02 Subclinical iodine-deficiency hypothyroidism: Secondary | ICD-10-CM | POA: Diagnosis not present

## 2024-11-06 DIAGNOSIS — E1065 Type 1 diabetes mellitus with hyperglycemia: Secondary | ICD-10-CM | POA: Diagnosis not present

## 2024-11-06 DIAGNOSIS — Z4681 Encounter for fitting and adjustment of insulin pump: Secondary | ICD-10-CM | POA: Diagnosis not present

## 2024-11-06 DIAGNOSIS — E781 Pure hyperglyceridemia: Secondary | ICD-10-CM | POA: Diagnosis not present

## 2024-11-29 ENCOUNTER — Encounter (HOSPITAL_COMMUNITY): Payer: Self-pay | Admitting: Psychiatry

## 2024-11-29 ENCOUNTER — Other Ambulatory Visit: Payer: Self-pay

## 2024-11-29 ENCOUNTER — Ambulatory Visit (HOSPITAL_COMMUNITY): Admitting: Psychiatry

## 2024-11-29 VITALS — BP 136/79 | HR 84 | Ht 64.0 in | Wt 172.0 lb

## 2024-11-29 DIAGNOSIS — F900 Attention-deficit hyperactivity disorder, predominantly inattentive type: Secondary | ICD-10-CM | POA: Diagnosis not present

## 2024-11-29 DIAGNOSIS — F411 Generalized anxiety disorder: Secondary | ICD-10-CM | POA: Diagnosis not present

## 2024-11-29 MED ORDER — SERTRALINE HCL 50 MG PO TABS: 50.0000 mg | ORAL_TABLET | Freq: Every day | ORAL | 1 refills | Status: AC

## 2024-11-29 NOTE — Progress Notes (Signed)
 [facility] MD/PA/NP OP Progress Note  11/29/2024 10:17 AM Alexander Wilkerson  MRN:  [id number]  Chief Complaint:  Chief Complaint  Patient presents with   Follow-up   Medication Refill   HPI: Patient came today for his appointment in the office.  He reported things are going very well.  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  He reported Thanksgiving was good and he was able to see his family member.  He denies any crying spells or any feeling of hopelessness or worthlessness.  He enjoyed going to 2 times a week to gym and do exercise.  He denies any panic attack, crying spells, depression or anhedonia.  He sleeps good.  He is happy with his blood sugar.  Since he has OmniPod/insulin pump his sugar is stable.  His last hemoglobin A1c was 6.0.  He is sleeping good.  He denies drinking or using any illegal substances.  His appetite is okay and his weight stable.  His attention concentration is good.  He is able to do multitasking and does not feel struggling with time management.  He wants to continue his Zoloft .  Visit Diagnosis:    ICD-10-CM   1. Generalized anxiety disorder  F41.1 sertraline  (ZOLOFT ) 50 MG tablet    2. Attention deficit hyperactivity disorder (ADHD), predominantly inattentive type  F90.0 sertraline  (ZOLOFT ) 50 MG tablet      Past Psychiatric History:  H/O  ADHD.  In the past year tried Adderall.  On Concerta  and Zoloft  since 2006.  Concerta  was discontinued in 2022 after feeling better. .   Past Medical History:  Past Medical History:  Diagnosis Date   Autism spectrum disorder    Diabetes mellitus, type II (HCC)     Past Surgical History:  Procedure Laterality Date   WISDOM TOOTH EXTRACTION      Family Psychiatric History: Reviewed  Family History: No family history on file.  Social History:  Social History   Socioeconomic History   Marital status: Single    Spouse name: Not on file   Number of children: Not on file    Years of education: Not on file   Highest education level: Not on file  Occupational History   Not on file  Tobacco Use   Smoking status: Never   Smokeless tobacco: Never  Substance and Sexual Activity   Alcohol use: No    Alcohol/week: 0.0 standard drinks of alcohol   Drug use: No   Sexual activity: Not Currently  Other Topics Concern   Not on file  Social History Narrative   Not on file   Social Drivers of Health   Tobacco Use: Low Risk (05/28/2024)   Patient History    Smoking Tobacco Use: Never    Smokeless Tobacco Use: Never    Passive Exposure: Not on file  Financial Resource Strain: Not on file  Food Insecurity: Not on file  Transportation Needs: Not on file  Physical Activity: Not on file  Stress: Not on file  Social Connections: Not on file  Depression (EYV7-0): Not on file  Alcohol Screen: Not on file  Housing: Not on file  Utilities: Not on file  Health Literacy: Not on file    Allergies: Allergies[1]  Metabolic Disorder Labs: Lab Results  Component Value Date   HGBA1C 7.7 (H) 03/08/2015   MPG 174 (H) 03/08/2015   MPG 105 02/23/2014   No results found for: PROLACTIN Lab Results  Component Value  Date   CHOL 138 02/23/2014   TRIG 56 02/23/2014   HDL 37 (L) 02/23/2014   CHOLHDL 3.7 02/23/2014   VLDL 11 02/23/2014   LDLCALC 90 02/23/2014   Lab Results  Component Value Date   TSH 1.614 03/08/2015   TSH 2.641 02/23/2014    Therapeutic Level Labs: No results found for: LITHIUM No results found for: VALPROATE No results found for: CBMZ  Current Medications: Current Outpatient Medications  Medication Sig Dispense Refill   BD PEN NEEDLE NANO U/F 32G X 4 MM MISC U UTD BID     Glucagon (BAQSIMI TWO PACK) 3 MG/DOSE POWD See admin instructions. (Patient not taking: Reported on 02/03/2022)     Insulin Disposable Pump (OMNIPOD 5 G7 INTRO, GEN 5,) KIT every 3 days     levothyroxine (SYNTHROID) 50 MCG tablet Take 50 mcg by mouth every morning.      NOVOLOG MIX 70/30 FLEXPEN (70-30) 100 UNIT/ML FlexPen   0   sertraline  (ZOLOFT ) 50 MG tablet Take 1 tablet (50 mg total) by mouth daily. 90 tablet 1   No current facility-administered medications for this visit.     Musculoskeletal: Strength & Muscle Tone: within normal limits Gait & Station: normal Patient leans: N/A  Psychiatric Specialty Exam: Review of Systems  Blood pressure 136/79, pulse 84, height 5' 4 (1.626 m), weight 172 lb (78 kg).Body mass index is 29.52 kg/m.  General Appearance: Fairly Groomed  Eye Contact:  Fair  Speech:  Slow  Volume:  Normal  Mood:  Euthymic  Affect:  Congruent  Thought Process:  Goal Directed  Orientation:  Full (Time, Place, and Person)  Thought Content: WDL   Suicidal Thoughts:  No  Homicidal Thoughts:  No  Memory:  Immediate;   Good Recent;   Good Remote;   Fair  Judgement:  Intact  Insight:  Present  Psychomotor Activity:  Normal  Concentration:  Concentration: Fair and Attention Span: Fair  Recall:  [employer] of Knowledge: Good  Language: Good  Akathisia:  No  Handed:  Right  AIMS (if indicated): not done  Assets:  Communication Skills Desire for Improvement Housing Resilience Talents/Skills Transportation  ADL's:  Intact  Cognition: WNL  Sleep:  Good   Screenings: PHQ2-9    Flowsheet Row Nutrition from 01/22/2016 in [facility] Nutr Diab Ed  - A Dept Of [place]. [facility]  PHQ-2 Total Score 0   Flowsheet Row Video Visit from [facility]/27/2021 in [facility] Video Visit from 01/28/2021 in [facility]  C-SSRS RISK CATEGORY No Risk No Risk     Assessment and Plan: Patient is a 33 year old single, Caucasian employed man with history of anxiety and ADHD.  Currently stable on Zoloft  50 mg and reported no side effects.  He used to take stimulant but has not needed in a while since his ADHD symptoms are stable.  Discussed medication side  effects and benefits.  Recommend to call back with any question or any concern.  Follow-up in 6 months.  Collaboration of Care: Collaboration of Care: Other provider involved in patient's care AEB notes are available in epic to review  Patient/Guardian was advised Release of Information must be obtained prior to any record release in order to collaborate their care with an outside provider. Patient/Guardian was advised if they have not already done so to contact the registration department to sign all necessary forms in order for us  to release information regarding their care.  Consent: Patient/Guardian gives verbal consent for treatment and assignment of benefits for services provided during this visit. Patient/Guardian expressed understanding and agreed to proceed.    Leni ONEIDA Client, MD 11/29/2024, 10:17 AM     [1] No Known Allergies

## 2025-01-11 ENCOUNTER — Other Ambulatory Visit (HOSPITAL_BASED_OUTPATIENT_CLINIC_OR_DEPARTMENT_OTHER): Payer: Self-pay

## 2025-01-11 ENCOUNTER — Other Ambulatory Visit: Payer: Self-pay

## 2025-01-11 ENCOUNTER — Other Ambulatory Visit (HOSPITAL_COMMUNITY): Payer: Self-pay

## 2025-01-11 MED ORDER — DEXCOM G7 SENSOR MISC
5 refills | Status: AC
  Filled 2025-01-11 (×2): qty 3, 30d supply, fill #0

## 2025-01-11 MED ORDER — DEXCOM G7 SENSOR MISC
1.0000 | 5 refills | Status: AC
  Filled 2025-01-11: qty 3, 30d supply, fill #0

## 2025-01-14 ENCOUNTER — Other Ambulatory Visit (HOSPITAL_BASED_OUTPATIENT_CLINIC_OR_DEPARTMENT_OTHER): Payer: Self-pay

## 2025-05-30 ENCOUNTER — Ambulatory Visit (HOSPITAL_COMMUNITY): Admitting: Psychiatry
